# Patient Record
Sex: Male | Born: 1955 | Race: White | Hispanic: No | Marital: Married | Smoking: Current every day smoker
Health system: Southern US, Community
[De-identification: ages and names within clinical notes are randomized; demographics above are authoritative.]

## PROBLEM LIST (undated history)

## (undated) DIAGNOSIS — J339 Nasal polyp, unspecified: Secondary | ICD-10-CM

## (undated) DIAGNOSIS — E119 Type 2 diabetes mellitus without complications: Secondary | ICD-10-CM

## (undated) DIAGNOSIS — G4730 Sleep apnea, unspecified: Secondary | ICD-10-CM

## (undated) DIAGNOSIS — J189 Pneumonia, unspecified organism: Secondary | ICD-10-CM

## (undated) DIAGNOSIS — J449 Chronic obstructive pulmonary disease, unspecified: Secondary | ICD-10-CM

## (undated) DIAGNOSIS — E785 Hyperlipidemia, unspecified: Secondary | ICD-10-CM

## (undated) DIAGNOSIS — R091 Pleurisy: Secondary | ICD-10-CM

## (undated) DIAGNOSIS — I10 Essential (primary) hypertension: Secondary | ICD-10-CM

## (undated) HISTORY — DX: Type 2 diabetes mellitus without complications: E11.9

## (undated) HISTORY — DX: Pneumonia, unspecified organism: J18.9

## (undated) HISTORY — DX: Chronic obstructive pulmonary disease, unspecified: J44.9

## (undated) HISTORY — PX: HERNIA REPAIR: SHX51

## (undated) HISTORY — DX: Nasal polyp, unspecified: J33.9

## (undated) HISTORY — PX: BRONCHOSCOPY: SUR163

## (undated) HISTORY — DX: Hyperlipidemia, unspecified: E78.5

## (undated) HISTORY — DX: Sleep apnea, unspecified: G47.30

## (undated) HISTORY — DX: Pleurisy: R09.1

## (undated) HISTORY — PX: NASAL SINUS SURGERY: SHX719

---

## 1999-04-16 HISTORY — PX: HERNIA REPAIR: SHX51

## 2008-02-16 ENCOUNTER — Emergency Department: Payer: Self-pay | Admitting: Emergency Medicine

## 2008-03-03 ENCOUNTER — Ambulatory Visit: Payer: Self-pay | Admitting: Orthopedic Surgery

## 2008-04-25 ENCOUNTER — Ambulatory Visit: Payer: Self-pay | Admitting: Orthopedic Surgery

## 2008-04-26 ENCOUNTER — Ambulatory Visit: Payer: Self-pay | Admitting: Orthopedic Surgery

## 2008-08-23 ENCOUNTER — Ambulatory Visit: Payer: Self-pay | Admitting: Family Medicine

## 2008-09-02 ENCOUNTER — Ambulatory Visit: Payer: Self-pay | Admitting: Family Medicine

## 2010-01-12 IMAGING — US THYROID ULTRASOUND
1 series · 18 of 25 positions shown · non-contrast
Comparison: none

REASON FOR EXAM: neck nodule RTinterior
COMMENTS:

PROCEDURE:     US  - US SOFT TISSUE HEAD/NECK  - August 23, 2008  [DATE]
RESULT:     Ultrasound of the thyroid area is performed.
No thyroid tissue is reported. No definite mass is appreciated.

[Series 1: thyroid ultrasound · 18 of 41 slices shown]
[im 1/41]
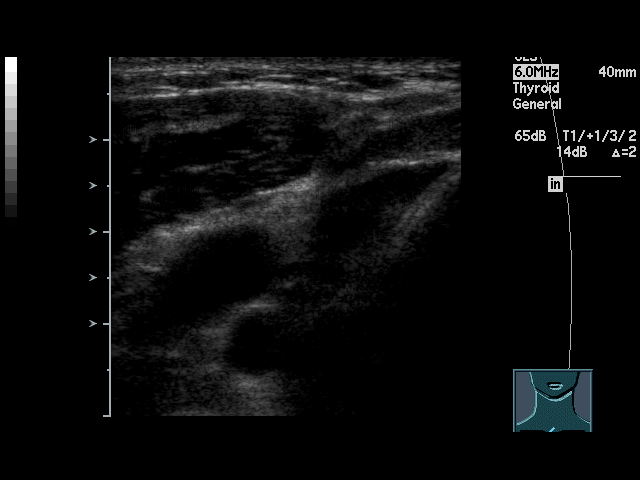
[im 4/41]
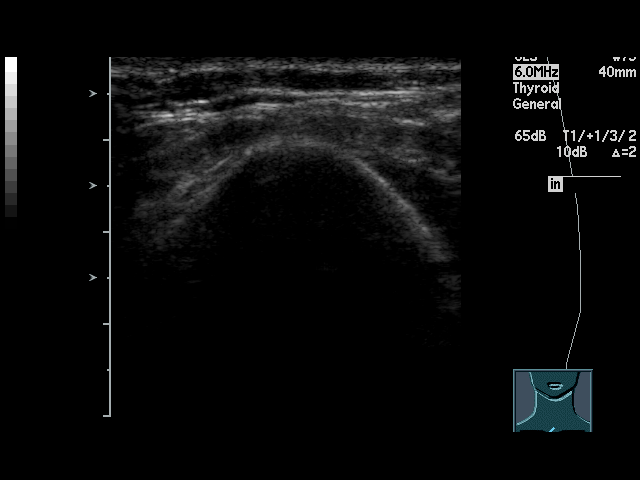
[im 6/41]
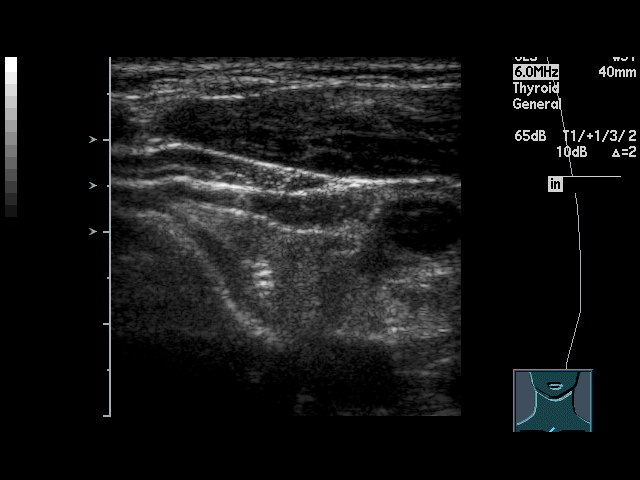
[im 7/41]
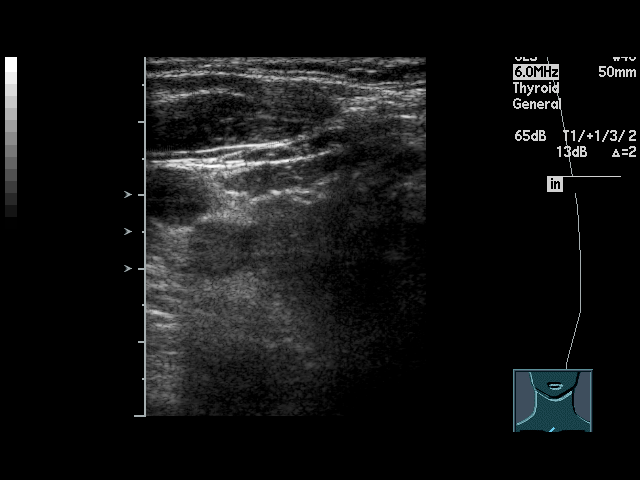
[im 11/41]
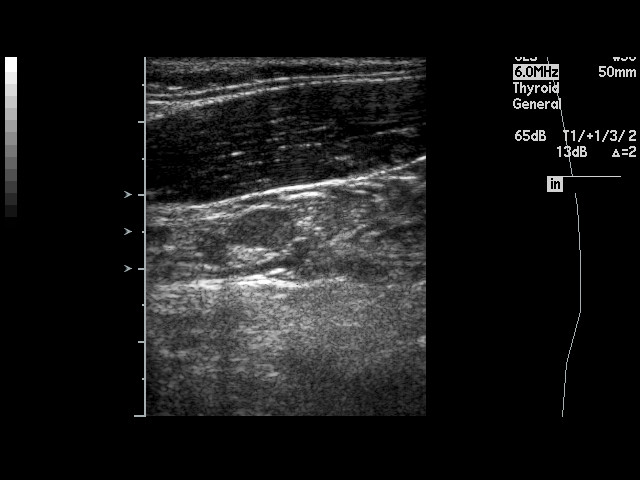
[im 12/41]
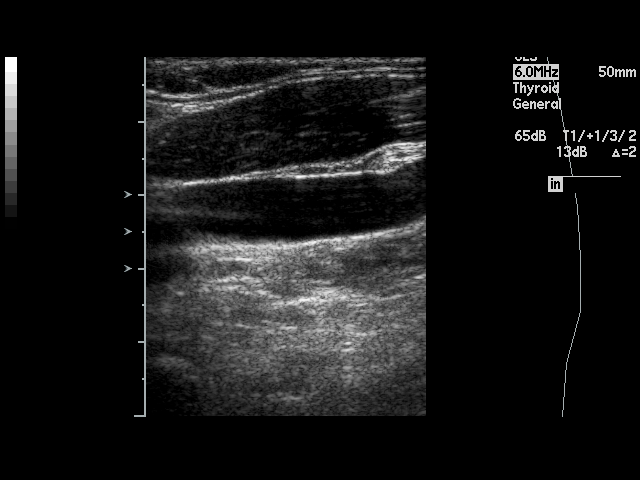
[im 16/41]
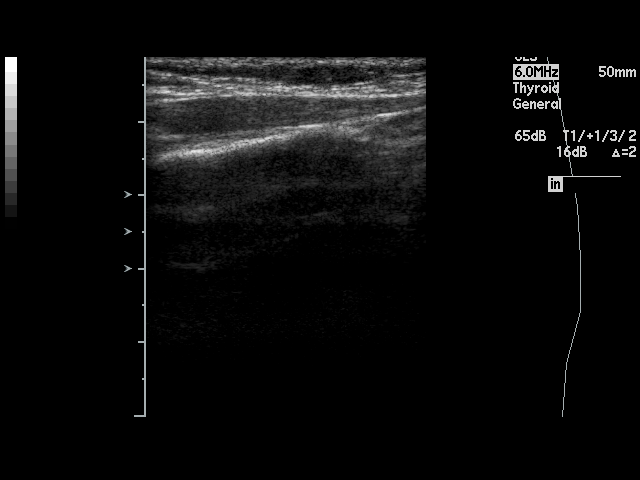
[im 17/41]
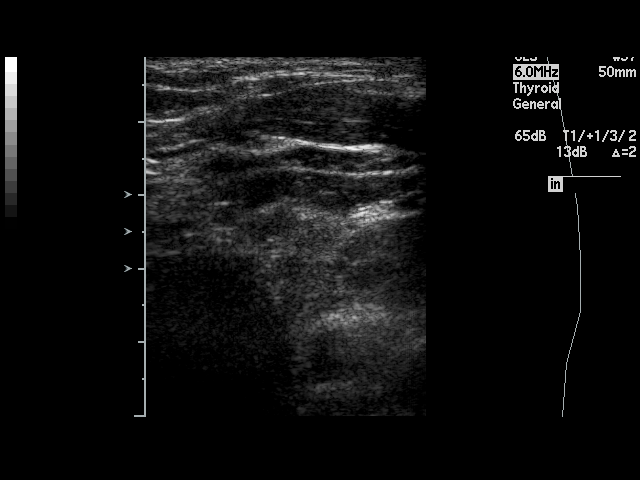
[im 19/41]
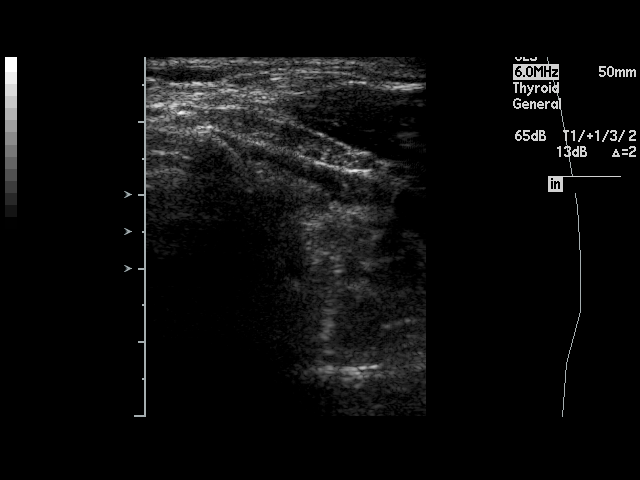
[im 22/41]
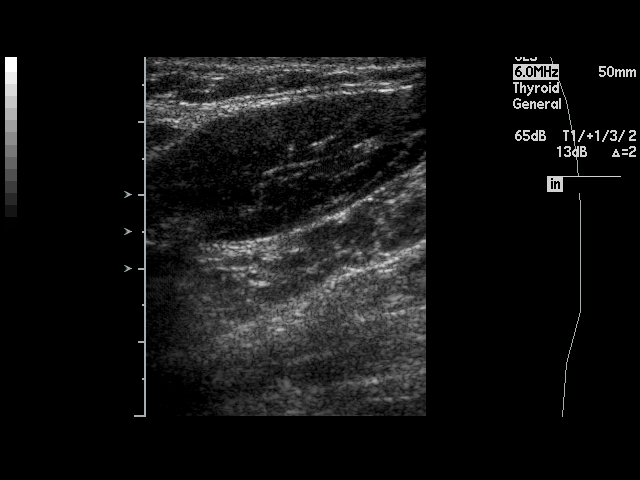
[im 24/41]
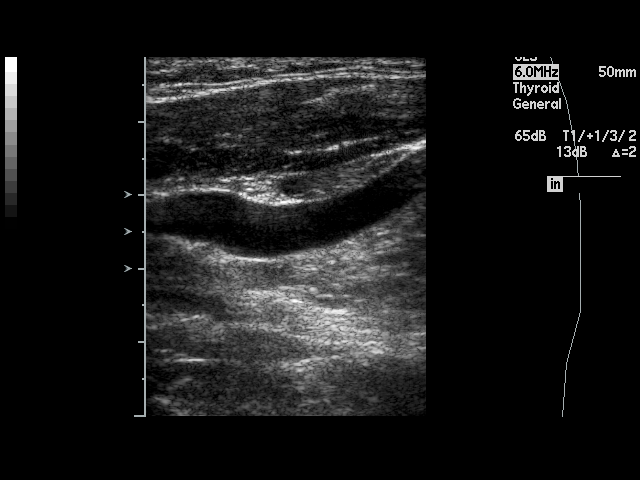
[im 26/41]
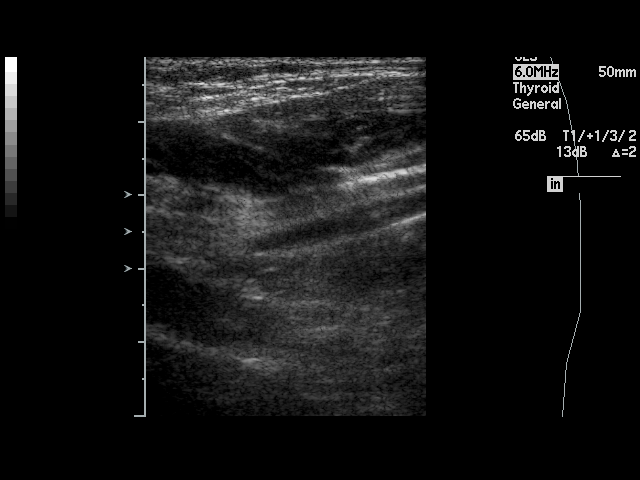
[im 29/41]
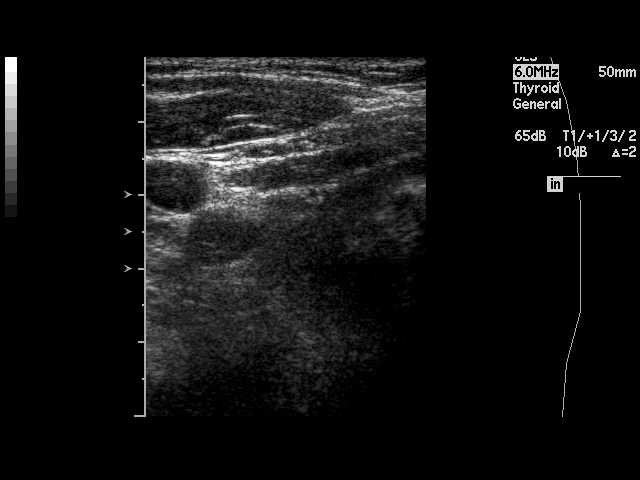
[im 31/41]
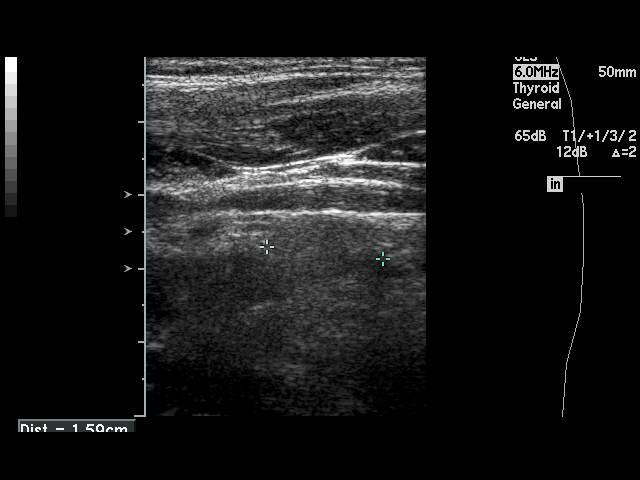
[im 34/41]
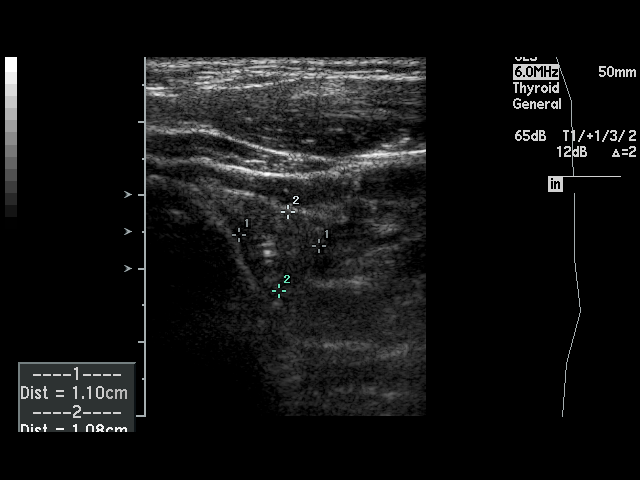
[im 36/41]
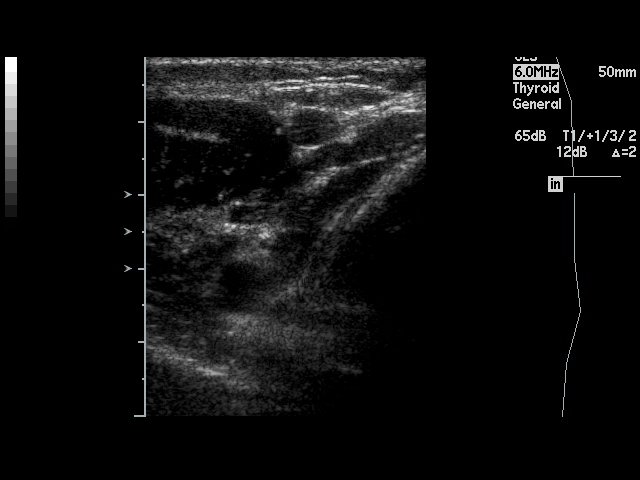
[im 37/41]
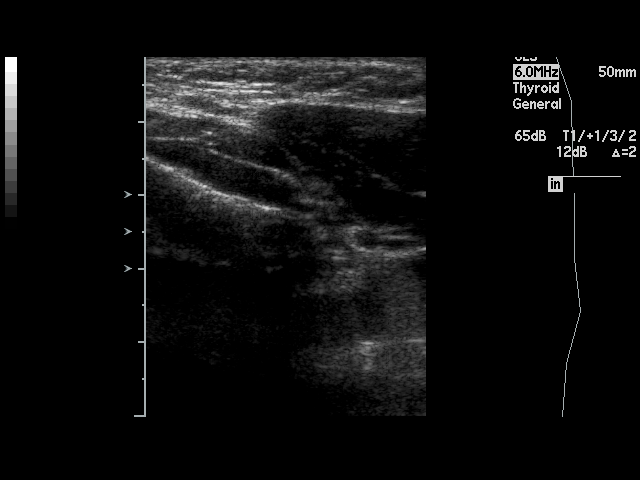
[im 41/41]
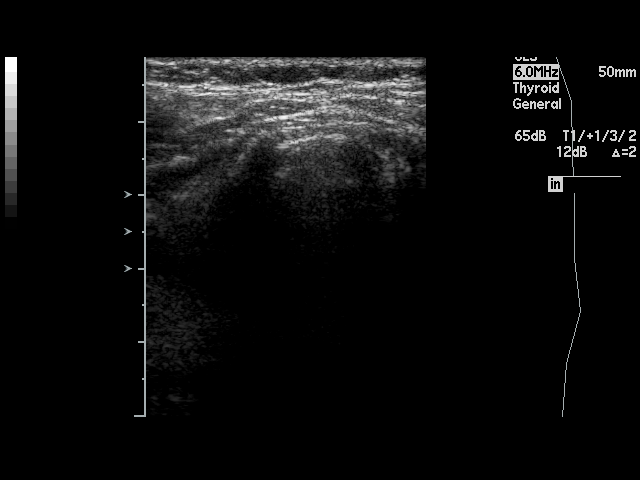

[18 of 25 positions shown; findings below may reference images not displayed]

IMPRESSION: No soft tissue mass appreciated. Correlate for history of
thyroidectomy.

## 2010-01-22 IMAGING — CT CT NECK WITH CONTRAST
2 series · 10 of 14 positions shown, 12 images · non-contrast
Comparison: none

REASON FOR EXAM: neck mass
COMMENTS:

[Series 2: soft tissue · axial · 0.49mm/px · z∈[+178,+396]mm · 7 of 99 slices shown, 9 images]
[im 13/99  soft-tissue]
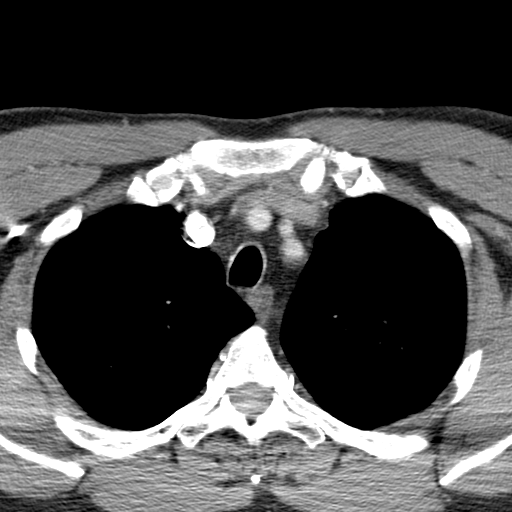
[im 13/99  bone]
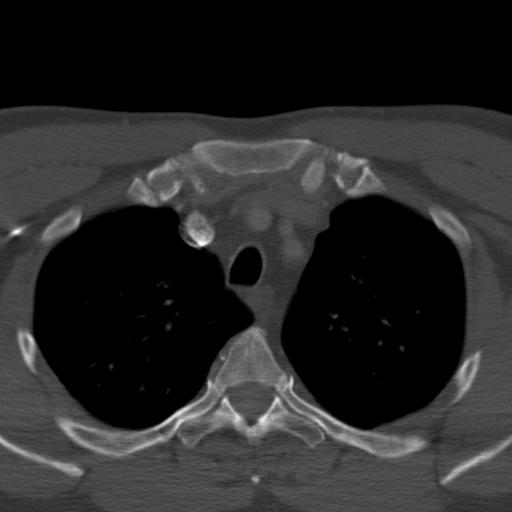
[im 25/99  bone]
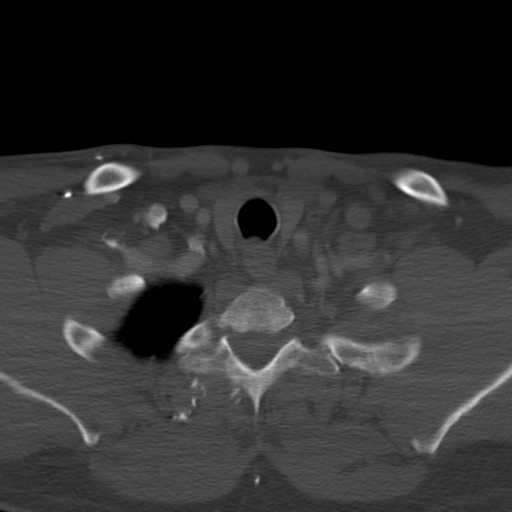
[im 37/99  bone]
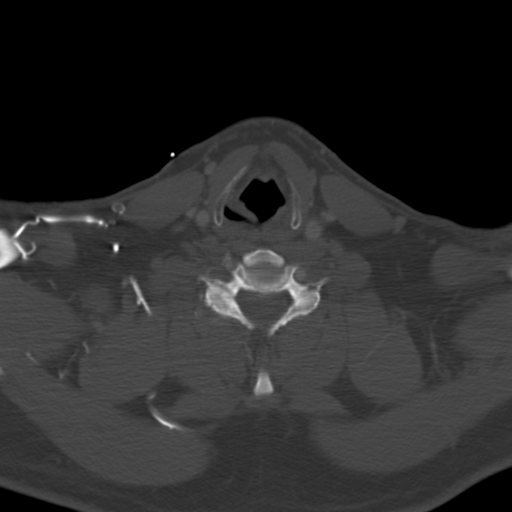
[im 50/99  bone]
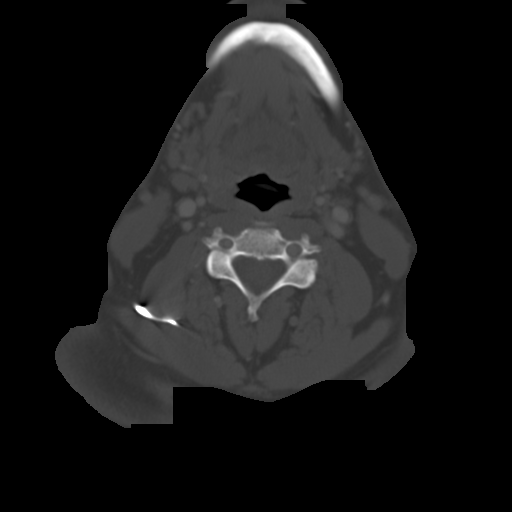
[im 62/99  soft-tissue]
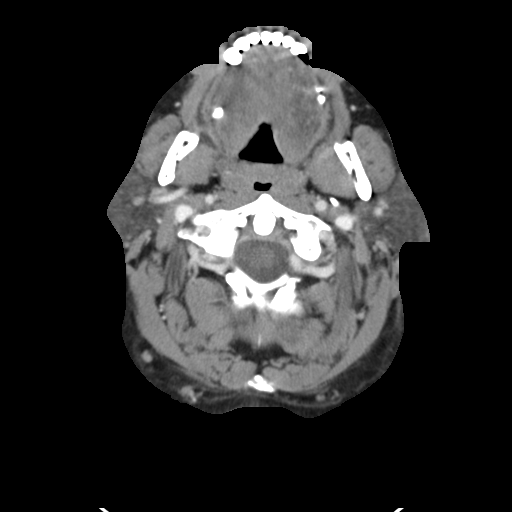
[im 62/99  bone]
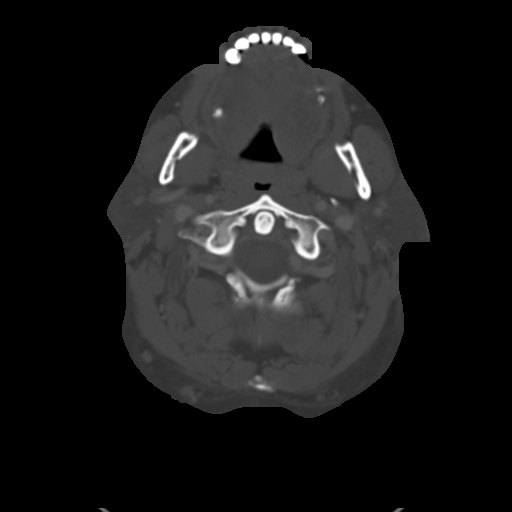
[im 74/99  bone]
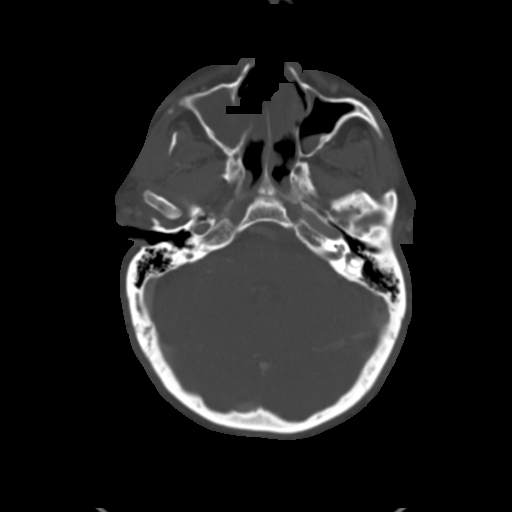
[im 86/99  bone]
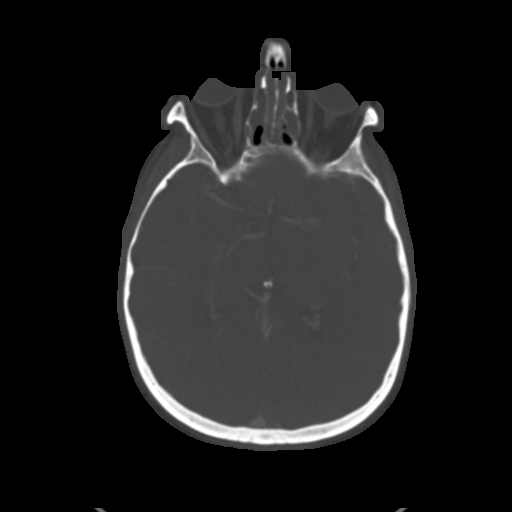

[Series 4: lung windows · axial · 0.66mm/px · z∈[+178,+250]mm · 3 of 49 slices shown]
[im 13/49  bone]
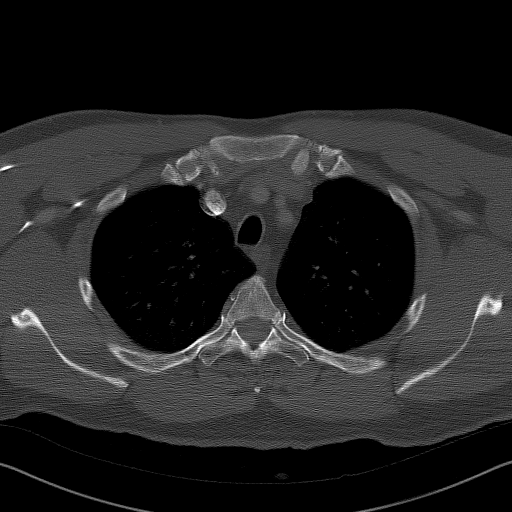
[im 25/49  bone]
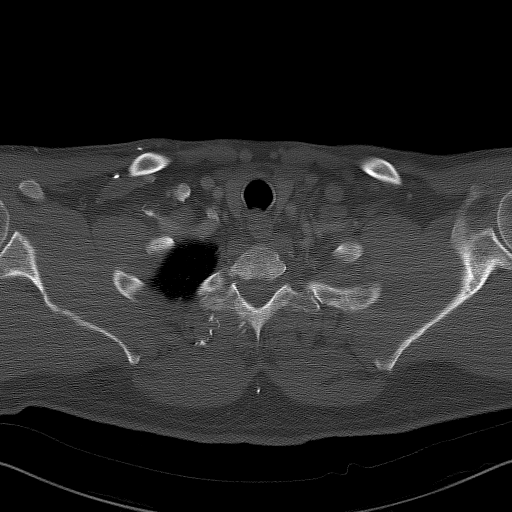
[im 37/49  bone]
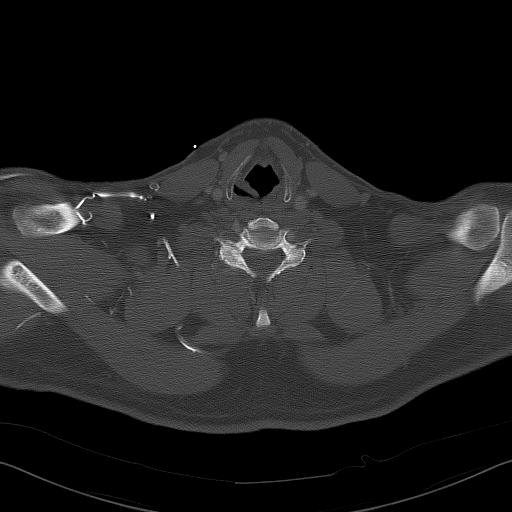

[10 of 14 positions shown; findings below may reference images not displayed]

PROCEDURE:     CT  - CT NECK WITH CONTRAST  - September 02, 2008 [DATE]

RESULT:     CT of the neck is performed utilizing 70 ml of Nsovue-UFQ
iodinated intravenous contrast. Multislice helical acquisition is
reconstructed at 3 mm slice thickness in the axial plane. There is no
previous CT for comparison. Correlation is made with the ultrasound targeted
to the right neck on 08/23/2008.

The area of concern is marked with a skin marker on image 62. There is no
underlying soft tissue lesion demonstrated. The thyroid lobes are small and
inferior to this level and appear to enhance homogeneously. The superior
mediastinum is unremarkable. There is no supraclavicular mass or adenopathy.
The paraspinal muscles appear to be within normal limits. Nonenlarged lymph
nodes are seen adjacent to the vascular structures in the submandibular
region. The submandibular and parotid glands appear unremarkable. The
tonsils appear to be unremarkable. The prevertebral and parapharyngeal
tissues are within normal limits.

There is extensive opacification of the sinuses. Previous sinus surgery is
noted in the medial maxillary wall on the left and on the right. Density is
seen in all the sinuses with some aeration in the ethmoids, sphenoids and
maxillary sinuses remaining. The frontal sinuses are completely opacified.
The base of the brain is unremarkable. The orbital regions are unremarkable.
IMPRESSION: 1. No evidence of a soft tissue mass in the right neck.
2. Extensive sinus disease with evidence of previous sinus surgery.
3. The thyroid lobes are present and enhance normally. These are inferior to
the area of concern in the right neck.
4. The images are somewhat degraded by motion artifact inferiorly.

## 2010-12-06 ENCOUNTER — Institutional Professional Consult (permissible substitution): Payer: Self-pay | Admitting: Pulmonary Disease

## 2010-12-28 ENCOUNTER — Ambulatory Visit: Payer: Self-pay | Admitting: Oncology

## 2011-01-03 ENCOUNTER — Ambulatory Visit: Payer: Self-pay | Admitting: Oncology

## 2011-01-14 ENCOUNTER — Ambulatory Visit: Payer: Self-pay | Admitting: Oncology

## 2011-02-14 ENCOUNTER — Ambulatory Visit: Payer: Self-pay | Admitting: Oncology

## 2011-04-05 ENCOUNTER — Emergency Department: Payer: Self-pay | Admitting: Emergency Medicine

## 2012-04-15 DIAGNOSIS — E119 Type 2 diabetes mellitus without complications: Secondary | ICD-10-CM

## 2012-04-15 HISTORY — DX: Type 2 diabetes mellitus without complications: E11.9

## 2012-07-13 ENCOUNTER — Inpatient Hospital Stay: Payer: Self-pay | Admitting: Internal Medicine

## 2012-07-13 DIAGNOSIS — I359 Nonrheumatic aortic valve disorder, unspecified: Secondary | ICD-10-CM

## 2012-07-13 LAB — COMPREHENSIVE METABOLIC PANEL
Albumin: 4.1 g/dL (ref 3.4–5.0)
Alkaline Phosphatase: 113 U/L (ref 50–136)
Anion Gap: 6 — ABNORMAL LOW (ref 7–16)
BUN: 12 mg/dL (ref 7–18)
Bilirubin,Total: 1 mg/dL (ref 0.2–1.0)
Calcium, Total: 8.9 mg/dL (ref 8.5–10.1)
Chloride: 99 mmol/L (ref 98–107)
Creatinine: 0.91 mg/dL (ref 0.60–1.30)
EGFR (African American): >60
EGFR (Non-African Amer.): >60
SGOT(AST): 25 U/L (ref 15–37)
SGPT (ALT): 35 U/L (ref 12–78)
Sodium: 134 mmol/L — ABNORMAL LOW (ref 136–145)

## 2012-07-13 LAB — CBC
HCT: 49.9 % (ref 40.0–52.0)
HGB: 17.7 g/dL (ref 13.0–18.0)
MCHC: 35.5 g/dL (ref 32.0–36.0)
MCV: 86 fL (ref 80–100)
RBC: 5.84 10*6/uL (ref 4.40–5.90)
WBC: 8.3 10*3/uL (ref 3.8–10.6)

## 2012-07-13 LAB — TROPONIN I: Troponin-I: <0.02 ng/mL

## 2012-07-13 LAB — CK TOTAL AND CKMB (NOT AT ARMC): CK, Total: 177 U/L (ref 35–232)

## 2012-07-14 LAB — CBC WITH DIFFERENTIAL/PLATELET
Basophil #: 0 10*3/uL (ref 0.0–0.1)
Lymphocyte #: 0.4 10*3/uL — ABNORMAL LOW (ref 1.0–3.6)
Lymphocyte %: 2.5 %
MCH: 29.6 pg (ref 26.0–34.0)
MCHC: 34.3 g/dL (ref 32.0–36.0)
MCV: 86 fL (ref 80–100)
Neutrophil #: 13.7 10*3/uL — ABNORMAL HIGH (ref 1.4–6.5)
Neutrophil %: 93.4 %
Platelet: 153 10*3/uL (ref 150–440)
RBC: 5.61 10*6/uL (ref 4.40–5.90)

## 2012-07-14 LAB — BASIC METABOLIC PANEL
Anion Gap: 7 (ref 7–16)
Calcium, Total: 9.5 mg/dL (ref 8.5–10.1)
Chloride: 99 mmol/L (ref 98–107)
Co2: 28 mmol/L (ref 21–32)
Creatinine: 0.97 mg/dL (ref 0.60–1.30)
EGFR (African American): >60
EGFR (Non-African Amer.): >60
Glucose: 355 mg/dL — ABNORMAL HIGH (ref 65–99)
Osmolality: 287 (ref 275–301)
Potassium: 4 mmol/L (ref 3.5–5.1)
Sodium: 134 mmol/L — ABNORMAL LOW (ref 136–145)

## 2012-07-15 LAB — CBC WITH DIFFERENTIAL/PLATELET
Basophil #: 0 10*3/uL (ref 0.0–0.1)
Eosinophil #: 0 10*3/uL (ref 0.0–0.7)
Eosinophil %: 0 %
HCT: 49.2 % (ref 40.0–52.0)
HGB: 16.8 g/dL (ref 13.0–18.0)
Lymphocyte #: 0.6 10*3/uL — ABNORMAL LOW (ref 1.0–3.6)
Lymphocyte %: 2.9 %
MCH: 29.6 pg (ref 26.0–34.0)
MCHC: 34.3 g/dL (ref 32.0–36.0)
MCV: 86 fL (ref 80–100)
Monocyte #: 0.7 x10 3/mm (ref 0.2–1.0)
Neutrophil #: 18.3 10*3/uL — ABNORMAL HIGH (ref 1.4–6.5)
Neutrophil %: 93.3 %
Platelet: 173 10*3/uL (ref 150–440)
RBC: 5.69 10*6/uL (ref 4.40–5.90)
RDW: 13.6 % (ref 11.5–14.5)
WBC: 19.6 10*3/uL — ABNORMAL HIGH (ref 3.8–10.6)

## 2012-07-15 LAB — BASIC METABOLIC PANEL
BUN: 33 mg/dL — ABNORMAL HIGH (ref 7–18)
Calcium, Total: 9 mg/dL (ref 8.5–10.1)
Chloride: 100 mmol/L (ref 98–107)
Creatinine: 0.93 mg/dL (ref 0.60–1.30)
EGFR (Non-African Amer.): >60
Potassium: 4.1 mmol/L (ref 3.5–5.1)

## 2012-07-16 LAB — HEMOGLOBIN A1C: Hemoglobin A1C: 10 % — ABNORMAL HIGH (ref 4.2–6.3)

## 2012-07-19 LAB — CREATININE, SERUM
Creatinine: 1.02 mg/dL (ref 0.60–1.30)
EGFR (African American): >60

## 2012-08-21 ENCOUNTER — Ambulatory Visit: Payer: Self-pay | Admitting: Specialist

## 2012-10-14 ENCOUNTER — Inpatient Hospital Stay: Payer: Self-pay | Admitting: Family Medicine

## 2012-10-14 LAB — CBC WITH DIFFERENTIAL/PLATELET
Basophil #: 0.1 10*3/uL (ref 0.0–0.1)
Eosinophil #: 0.1 10*3/uL (ref 0.0–0.7)
HCT: 44.5 % (ref 40.0–52.0)
HGB: 15.5 g/dL (ref 13.0–18.0)
Lymphocyte #: 1.2 10*3/uL (ref 1.0–3.6)
Monocyte #: 1.4 x10 3/mm — ABNORMAL HIGH (ref 0.2–1.0)
Monocyte %: 7.6 %
Platelet: 140 10*3/uL — ABNORMAL LOW (ref 150–440)
RDW: 14.3 % (ref 11.5–14.5)
WBC: 18.7 10*3/uL — ABNORMAL HIGH (ref 3.8–10.6)

## 2012-10-14 LAB — COMPREHENSIVE METABOLIC PANEL
Albumin: 4 g/dL (ref 3.4–5.0)
Alkaline Phosphatase: 98 U/L (ref 50–136)
Anion Gap: 10 (ref 7–16)
Calcium, Total: 9.4 mg/dL (ref 8.5–10.1)
Chloride: 100 mmol/L (ref 98–107)
Co2: 26 mmol/L (ref 21–32)
EGFR (African American): >60
EGFR (Non-African Amer.): >60
Osmolality: 275 (ref 275–301)
Potassium: 3.8 mmol/L (ref 3.5–5.1)
SGOT(AST): 17 U/L (ref 15–37)
Sodium: 136 mmol/L (ref 136–145)

## 2012-10-15 LAB — CBC WITH DIFFERENTIAL/PLATELET
Basophil #: 0 10*3/uL (ref 0.0–0.1)
Basophil %: 0.1 %
Eosinophil #: 0 10*3/uL (ref 0.0–0.7)
Eosinophil %: 0 %
MCH: 29.8 pg (ref 26.0–34.0)
MCHC: 35.2 g/dL (ref 32.0–36.0)
Neutrophil #: 18.2 10*3/uL — ABNORMAL HIGH (ref 1.4–6.5)
Platelet: 133 10*3/uL — ABNORMAL LOW (ref 150–440)
RBC: 5.15 10*6/uL (ref 4.40–5.90)
WBC: 19.3 10*3/uL — ABNORMAL HIGH (ref 3.8–10.6)

## 2012-10-15 LAB — BASIC METABOLIC PANEL
BUN: 12 mg/dL (ref 7–18)
Calcium, Total: 9.1 mg/dL (ref 8.5–10.1)
Chloride: 103 mmol/L (ref 98–107)
Co2: 28 mmol/L (ref 21–32)
Creatinine: 1.17 mg/dL (ref 0.60–1.30)
Osmolality: 287 (ref 275–301)

## 2012-10-16 LAB — CBC WITH DIFFERENTIAL/PLATELET
Basophil #: 0.1 10*3/uL (ref 0.0–0.1)
Basophil %: 0.4 %
Eosinophil #: 0 10*3/uL (ref 0.0–0.7)
HGB: 14.1 g/dL (ref 13.0–18.0)
Lymphocyte #: 1 10*3/uL (ref 1.0–3.6)
Lymphocyte %: 4.8 %
MCH: 30.1 pg (ref 26.0–34.0)
MCV: 84 fL (ref 80–100)
Monocyte #: 1.2 x10 3/mm — ABNORMAL HIGH (ref 0.2–1.0)
Neutrophil #: 18.1 10*3/uL — ABNORMAL HIGH (ref 1.4–6.5)
Neutrophil %: 88.9 %
Platelet: 151 10*3/uL (ref 150–440)

## 2012-10-17 LAB — CBC WITH DIFFERENTIAL/PLATELET
Eosinophil #: 0.1 10*3/uL (ref 0.0–0.7)
HCT: 40.4 % (ref 40.0–52.0)
HGB: 14 g/dL (ref 13.0–18.0)
MCH: 29.6 pg (ref 26.0–34.0)
MCV: 86 fL (ref 80–100)
Monocyte #: 0.8 x10 3/mm (ref 0.2–1.0)
Neutrophil #: 8.6 10*3/uL — ABNORMAL HIGH (ref 1.4–6.5)
Neutrophil %: 74.7 %
Platelet: 144 10*3/uL — ABNORMAL LOW (ref 150–440)
RDW: 14.3 % (ref 11.5–14.5)
WBC: 11.5 10*3/uL — ABNORMAL HIGH (ref 3.8–10.6)

## 2012-10-17 LAB — BASIC METABOLIC PANEL
Anion Gap: 7 (ref 7–16)
BUN: 19 mg/dL — ABNORMAL HIGH (ref 7–18)
Calcium, Total: 8.6 mg/dL (ref 8.5–10.1)
Chloride: 104 mmol/L (ref 98–107)
Co2: 29 mmol/L (ref 21–32)
Creatinine: 1 mg/dL (ref 0.60–1.30)
EGFR (Non-African Amer.): >60
Glucose: 189 mg/dL — ABNORMAL HIGH (ref 65–99)
Osmolality: 287 (ref 275–301)
Potassium: 3.4 mmol/L — ABNORMAL LOW (ref 3.5–5.1)
Sodium: 140 mmol/L (ref 136–145)

## 2012-10-18 LAB — CBC WITH DIFFERENTIAL/PLATELET
Basophil #: 0.1 10*3/uL (ref 0.0–0.1)
Basophil %: 0.6 %
Eosinophil #: 0.2 10*3/uL (ref 0.0–0.7)
Eosinophil %: 2 %
HCT: 40.1 % (ref 40.0–52.0)
HGB: 14.2 g/dL (ref 13.0–18.0)
Lymphocyte #: 1.9 10*3/uL (ref 1.0–3.6)
Lymphocyte %: 18.7 %
MCH: 30 pg (ref 26.0–34.0)
MCHC: 35.4 g/dL (ref 32.0–36.0)
MCV: 85 fL (ref 80–100)
Monocyte #: 0.7 x10 3/mm (ref 0.2–1.0)
Monocyte %: 7.5 %
Neutrophil #: 7.1 10*3/uL — ABNORMAL HIGH (ref 1.4–6.5)
Neutrophil %: 71.2 %
Platelet: 153 10*3/uL (ref 150–440)
RBC: 4.74 10*6/uL (ref 4.40–5.90)
RDW: 14.2 % (ref 11.5–14.5)
WBC: 10 10*3/uL (ref 3.8–10.6)

## 2012-10-18 LAB — POTASSIUM: Potassium: 3.8 mmol/L (ref 3.5–5.1)

## 2012-10-19 LAB — CULTURE, BLOOD (SINGLE)

## 2013-04-15 DIAGNOSIS — R091 Pleurisy: Secondary | ICD-10-CM

## 2013-04-15 HISTORY — DX: Pleurisy: R09.1

## 2014-03-21 ENCOUNTER — Inpatient Hospital Stay: Payer: Self-pay | Admitting: Internal Medicine

## 2014-03-21 DIAGNOSIS — A419 Sepsis, unspecified organism: Secondary | ICD-10-CM | POA: Diagnosis not present

## 2014-03-21 LAB — COMPREHENSIVE METABOLIC PANEL
ALBUMIN: 3.5 g/dL (ref 3.4–5.0)
ALT: 34 U/L
AST: 22 U/L (ref 15–37)
Alkaline Phosphatase: 150 U/L — ABNORMAL HIGH
Anion Gap: 12 (ref 7–16)
BILIRUBIN TOTAL: 1 mg/dL (ref 0.2–1.0)
BUN: 17 mg/dL (ref 7–18)
CHLORIDE: 99 mmol/L (ref 98–107)
CO2: 27 mmol/L (ref 21–32)
Calcium, Total: 9 mg/dL (ref 8.5–10.1)
Creatinine: 1.39 mg/dL — ABNORMAL HIGH (ref 0.60–1.30)
EGFR (African American): >60
EGFR (Non-African Amer.): 56 — ABNORMAL LOW
GLUCOSE: 184 mg/dL — AB (ref 65–99)
Osmolality: 282 (ref 275–301)
POTASSIUM: 4.5 mmol/L (ref 3.5–5.1)
SODIUM: 138 mmol/L (ref 136–145)
TOTAL PROTEIN: 7.9 g/dL (ref 6.4–8.2)

## 2014-03-21 LAB — URINALYSIS, COMPLETE
BACTERIA: NONE SEEN
Bilirubin,UR: NEGATIVE
Blood: NEGATIVE
Glucose,UR: >=500 mg/dL (ref 0–75)
Leukocyte Esterase: NEGATIVE
Nitrite: NEGATIVE
PH: 5 (ref 4.5–8.0)
Protein: NEGATIVE
Specific Gravity: 1.042 (ref 1.003–1.030)
Squamous Epithelial: NONE SEEN

## 2014-03-21 LAB — CBC
HCT: 48.4 % (ref 40.0–52.0)
HGB: 15.8 g/dL (ref 13.0–18.0)
MCH: 29.5 pg (ref 26.0–34.0)
MCHC: 32.6 g/dL (ref 32.0–36.0)
MCV: 91 fL (ref 80–100)
PLATELETS: 152 10*3/uL (ref 150–440)
RBC: 5.35 10*6/uL (ref 4.40–5.90)
RDW: 14.2 % (ref 11.5–14.5)
WBC: 17.6 10*3/uL — ABNORMAL HIGH (ref 3.8–10.6)

## 2014-03-21 LAB — TROPONIN I

## 2014-03-21 LAB — CK TOTAL AND CKMB (NOT AT ARMC)
CK, Total: 154 U/L (ref 39–308)
CK-MB: 2.8 ng/mL (ref 0.5–3.6)

## 2014-03-22 LAB — CBC WITH DIFFERENTIAL/PLATELET
BASOS ABS: 0 10*3/uL (ref 0.0–0.1)
Basophil %: 0.1 %
EOS ABS: 0 10*3/uL (ref 0.0–0.7)
Eosinophil %: 0.1 %
HCT: 42.4 % (ref 40.0–52.0)
HGB: 13.9 g/dL (ref 13.0–18.0)
LYMPHS PCT: 3.1 %
Lymphocyte #: 0.5 10*3/uL — ABNORMAL LOW (ref 1.0–3.6)
MCH: 29.7 pg (ref 26.0–34.0)
MCHC: 32.8 g/dL (ref 32.0–36.0)
MCV: 91 fL (ref 80–100)
Monocyte #: 0.6 x10 3/mm (ref 0.2–1.0)
Monocyte %: 4 %
Neutrophil #: 14.1 10*3/uL — ABNORMAL HIGH (ref 1.4–6.5)
Neutrophil %: 92.7 %
PLATELETS: 129 10*3/uL — AB (ref 150–440)
RBC: 4.68 10*6/uL (ref 4.40–5.90)
RDW: 14.4 % (ref 11.5–14.5)
WBC: 15.2 10*3/uL — AB (ref 3.8–10.6)

## 2014-03-22 LAB — BASIC METABOLIC PANEL
Anion Gap: 8 (ref 7–16)
BUN: 19 mg/dL — AB (ref 7–18)
CALCIUM: 8.6 mg/dL (ref 8.5–10.1)
CREATININE: 1.11 mg/dL (ref 0.60–1.30)
Chloride: 103 mmol/L (ref 98–107)
Co2: 26 mmol/L (ref 21–32)
EGFR (African American): >60
EGFR (Non-African Amer.): >60
GLUCOSE: 199 mg/dL — AB (ref 65–99)
OSMOLALITY: 282 (ref 275–301)
POTASSIUM: 4.2 mmol/L (ref 3.5–5.1)
Sodium: 137 mmol/L (ref 136–145)

## 2014-03-22 LAB — MAGNESIUM: Magnesium: 2.2 mg/dL

## 2014-03-23 LAB — INFLUENZA A,B,H1N1 - PCR (ARMC)
H1N1FLUPCR: NOT DETECTED
Influenza A By PCR: NEGATIVE
Influenza B By PCR: NEGATIVE

## 2014-03-25 LAB — CBC WITH DIFFERENTIAL/PLATELET
Bands: 1 %
COMMENT - H1-COM1: NORMAL
COMMENT - H1-COM2: NORMAL
HCT: 40.4 % (ref 40.0–52.0)
HGB: 13 g/dL (ref 13.0–18.0)
Lymphocytes: 10 %
MCH: 29.3 pg (ref 26.0–34.0)
MCHC: 32.2 g/dL (ref 32.0–36.0)
MCV: 91 fL (ref 80–100)
METAMYELOCYTE: 2 %
Platelet: 153 10*3/uL (ref 150–440)
RBC: 4.44 10*6/uL (ref 4.40–5.90)
RDW: 14 % (ref 11.5–14.5)
SEGMENTED NEUTROPHILS: 81 %
VARIANT LYMPHOCYTE - H1-RLYMPH: 6 %
WBC: 12.1 10*3/uL — ABNORMAL HIGH (ref 3.8–10.6)

## 2014-03-25 LAB — CREATININE, SERUM: Creatinine: 0.94 mg/dL (ref 0.60–1.30)

## 2014-03-25 LAB — PRO B NATRIURETIC PEPTIDE: B-Type Natriuretic Peptide: 1907 pg/mL — ABNORMAL HIGH (ref 0–125)

## 2014-03-26 LAB — EXPECTORATED SPUTUM ASSESSMENT W REFEX TO RESP CULTURE

## 2014-03-26 LAB — CULTURE, BLOOD (SINGLE)

## 2014-08-05 NOTE — Consult Note (Signed)
Brief Consult Note: Diagnosis: Panic disorder with agoraphobia, PTSD, delirium secondary to medical condition/medications.   Patient was seen by consultant.   Consult note dictated.   Recommend further assessment or treatment.   Orders entered.   Comments: Mr. Curtis Russell has a h/o severe anxiety. His medications are prescribed by his PCP. He does not have a psychiatrist. He desires to work with a Set designerChristian therapist in the future to resolve issues surrounding his mother;s daeth in July 2013. he is here for exacerbation of COPD.  PLAN: 1. Please continue Effexor for depression/anxiety. Will continue Clonazepam 1 mg tid.   2. We will hold stimulants. They usually worsen anxiety.  3. Will offer Minipress for severe PTSD related nightmares and flashbacks.  4. will try to find a therapist in the comunity.  Electronic Signatures: Curtis Russell, Curtis Russell (MD)  (Signed 02-Apr-14 11:43)  Authored: Brief Consult Note   Last Updated: 02-Apr-14 11:43 by Curtis Russell, Curtis Russell (MD)

## 2014-08-05 NOTE — H&P (Signed)
PATIENT NAME:  Curtis Russell, Curtis Russell MR#:  [id number] DATE OF BIRTH:  Sep 06, 1955  DATE OF ADMISSION:  07/13/2012  PRIMARY CARE PHYSICIAN: Jerl MinaJames Hedrick, MD  REFERRING PHYSICIAN: Chiquita LothJade Sung, MD  CHIEF COMPLAINT: Shortness of breath.   HISTORY OF PRESENT ILLNESS: The patient is a 59 year old Caucasian male who is morbidly obese with a past medical history of COPD, still smoking, hypertension, hyperlipidemia, diabetes mellitus and chronic nasal polyps who is presenting to the ER with the chief complaint of shortness of breath. The patient is reporting that he has been short of breath for the past 2 to 3 days and slowly it has been getting worse. He is reporting that every spring his nasal polyps act up and he becomes shortness of breath. Although the patient has been experiencing shortness of breath for the past 2 to 3 days, it has been worse on Sunday. He has been coughing and feeling tight in his chest. Also, he could hear wheezing when breathing and the patient has unable to talk in full sentences. His wife called EMS and the patient was brought into the ER. The patient has received continuous nebulizer treatments and Solu-Medrol 125 mg IV. Chest x-ray has revealed no acute findings. Hospitalist team is called to admit the patient for acute exacerbation of COPD. During my examination, the patient is still working hard and was unable to talk in complete sentences. He is feeling tired. Blood gas was ordered which has revealed a pH of 7.35, pCO2 45, and pO2 of 50.  As the patient is hypoxemic and still short of breath with audible wheezing and accessory muscle usage, he is placed on BiPAP. The patient's wife is at bedside. He denies any chest pain, dizziness or loss of consciousness. No complaints of nausea, vomiting or diarrhea. Denies any fevers but complaining of runny nose, sneezing and coughing.   PAST MEDICAL HISTORY:  1.  Chronic history of COPD. 2.  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 3.  Hypertension. 4.   Hyperlipidemia. 5.  Diabetes mellitus type 2.   PAST SURGICAL HISTORY:  1.  Hernia repair. 2.  Ganglion cyst resection.  infection.   ALLERGIES: BEE STINGS.  PSYCHOSOCIAL HISTORY: Lives at home with wife. He smokes 1-1/2 to 2 packs a day. Occasional intake of wine. Denies any street drugs.   FAMILY HISTORY: His mom has history of congestive heart failure and COPD.  Dad has history of diabetes mellitus and congestive heart failure.   HOME MEDICATIONS:  1.  Trazodone 50 mg once a day. 2.  Symbicort 160 mg/4.5 two puffs inhalation 2 times a day. 3.  Ritalin 20 mg 2 tablets once a day. 4.  Metformin 500 mg twice a day. 5.  Hydrochlorothiazide/lisinopril 25/20 mg p.o. once daily. 6.  Clonazepam 1 mg at bedtime and 2 tablets (2 mg q. a.m.). 7.  Effexor XL 150 mg 1 capsule p.o. once daily.   REVIEW OF SYSTEMS: CONSTITUTIONAL: Denies any fever, but complaining of fatigue and tiredness. No weight loss or weight gain.  EYES: No glaucoma or cataracts. No blurry vision.  ENT: Denies any tinnitus, epistaxis, discharge, hearing loss, snoring or postnasal drip.  RESPIRATORY: Complaining of cough. Positive wheezing. Dyspneic. Has chronic history of COPD. Denies any hemoptysis and denies any painful respiration.  CARDIOVASCULAR: No chest pain, orthopnea, palpitations or syncope.  GASTROINTESTINAL: No nausea, vomiting, diarrhea or GERD.  GENITOURINARY: No dysuria or hematuria.  ENDOCRINE:  No polyuria, nocturia or thyroid problems. HEMATOLOGIC/LYMPHATIC: Denies any anemia or easy bruising. No recent  history of bleeding.  INTEGUMENTARY: No acne, rash or lesions.  MUSCULOSKELETAL: No joint pain in the neck, back, shoulder, knees or hips. Denies any arthritis. No history of gout.  NEUROLOGIC: Denies any vertigo, ataxia or dementia. No history of CVA or TIA in the past PSYCH:  Has chronic history of insomnia. Has chronic history of insomnia. Denies any bipolar disorder, depression or schizophrenia.    PHYSICAL EXAMINATION: VITAL SIGNS: Temperature 98.3, pulse 108, respirations 35, blood pressure is 158/74 and pulse oximetry 96% on 2 liters.  GENERAL APPEARANCE: In mild to moderate distress, using accessory muscles. HEENT:  Normocephalic, atraumatic. Pupils are equally reacting to light and accommodation. No scleral icterus. No conjunctival injection. Extraocular movements are intact. No sinus tenderness. Nares are with mucus. Moist mucous membranes. Oropharynx is intact. No exudates. Dentition is grossly normal.  NECK: Supple. No JVD. No thyromegaly. No lymphadenopathy.  LUNGS: Coarse bronchial breath sounds with diffuse wheezing and accessory muscle usage. No anterior chest wall tenderness on palpation.  HEART:  S1 and S2 regular rate and rhythm, tachycardic. No murmurs. No peripheral edema. Peripheral pulses are 2+.  GASTROINTESTINAL: Soft, obese. Bowel sounds are positive in all 4 quadrants. Nontender and nondistended. No masses felt. No hepatosplenomegaly.  NEUROLOGIC:  Awake, alert and oriented x 3. Cranial nerves II through XII are intact. Motor and sensory are intact. Reflexes are 2+.  MUSCULOSKELETAL: No peripheral edema or cyanosis or clubbing. Peripheral pulses are 2+.  SKIN: Normal turgor. Warm to touch. No lesions. No rashes.  PSYCHIATRIC: Normal mood and affect.  MUSCULOSKELETAL: Range of motion of all 4 extremities is grossly intact   LABORATORY AND DIAGNOSTICS:  Chest x-ray:  No acute findings. A 12-lead EKG: Sinus tachy at 119 beats per minute, normal PR interval, normal QRS duration, incomplete right bundle branch block, and right axis deviation. Nonspecific ST-T wave changes are present.   ABG: pH is 7.35, pCO2 45, pO2 50. Calcium 1.13. Oxyhemoglobin 80.9. Carboxyhemoglobin 2.7.   Hemoglobin 0.9. Pulse ox 83.8.   Glucose 276, BUN 12, creatinine 0.91, sodium 134, potassium 3.6, chloride 99, CO2 29, GFR greater than 60, anion gap 6, serum osmolality 278 and calcium 8.9. LFTs  are within normal range. CK total 177, CPK-MB 2.6 and troponin less than 0.02. WBC 8.3, hemoglobin 17.7, hematocrit 39.9, platelets 133 and MCV 87.    ASSESSMENT AND PLAN: A 59 year old Caucasian male with chronic history of chronic obstructive pulmonary disease, still smoking 2 packs per day, who is coming to the ER with worsening of shortness of breath associated with cough and chest tightness.  1.  Acute respiratory failure with hypoxia. The patient was started on BiPAP. Will repeat ABGs at 9:00 a.m. in the morning.  2.  Acute exacerbation of chronic obstructive pulmonary disease. We will provide Solu-Medrol to be given at 60 mg intravenous every 8 hours. We will provide nebulizer treatments and IV levofloxacin.  3.  Nicotine dependence. The patient was counseled to quit smoking for 15 minutes and will start him on a nicotine patch.  4.  Hypertension. Blood pressure is elevated, probably from the acute respiratory failure and stress induced. Will monitor and titrate medications on an as needed basis.  5.  Diabetes mellitus. Continue sliding scale insulin and his home medication.  6.  Hyperlipidemia. Continue statin.  7.  History of polycythemia. Please follow up with oncology as an outpatient as recommended by them.  8.  We will provide gastrointestinal prophylaxis with Protonix and deep vein thrombosis prophylaxis with Lovenox  subcutaneous.   The patient is FULL CODE. The diagnosis and plan of care was discussed in detail with the patient. He is aware of the plan.   TOTAL CRITICAL CARE TIME SPENT: 60 minutes. ____________________________ Ramonita Lab, MD ag:sb D: 07/13/2012 03:48:46 ET T: 07/13/2012 07:05:52 ET JOB#: [id number]  cc: Ramonita Lab, MD, <Dictator> Rhona Leavens. Burnett Sheng, MD Ramonita Lab MD ELECTRONICALLY SIGNED 07/21/2012 6:37

## 2014-08-05 NOTE — Discharge Summary (Signed)
PATIENT NAME:  Curtis Russell, Curtis Russell MR#:  [id number] DATE OF BIRTH:  1955-11-05  DATE OF ADMISSION:  07/13/2012 DATE OF DISCHARGE:  07/20/2012  DISCHARGE DIAGNOSES:  Chronic obstructive pulmonary disease exacerbation, acute-on-chronic respiratory failure needing oxygen, acute delirium due to infection, include anxiety disorder, diabetes, hypertension, hyperlipidemia, pansinusitis.   CODE STATUS ON DISCHARGE: FULL CODE.  MEDICATIONS ON DISCHARGE: Clonazepam 1 mg oral tablet, 2 tablets once a day in the morning and 1 mg in the evening, methylphenidate 20 mg oral tablet, 2 tablets orally once a day, Effexor, extended-release 150 mg oral capsule once a day, Ritalin 10 mg oral tablet once as needed, Symbicort 2 puffs inhaled 2 times a day, prednisone 10 mg oral tablets, start with 60 mg and taper x 10 mg daily until complete, prazosin 1 mg oral capsule 2 times a day, trazodone 100 mg tablet once a day at bedtime, metformin 1000 mg oral tablet 2 times a day, insulin 25 units subcutaneous once a day at bedtime, risperidone 0.5 mg oral tablet 2 times a day for 5 days, nicotine 21 mg patch transdermal once a day, Spiriva 18 mcg inhalation capsule once a day.   HOME OXYGEN ON DISCHARGE: Yes, 3 liters nasal cannula.   DIET ON DISCHARGE: Low-sodium, carbohydrate-controlled ADA diet, regular consistency.   ACTIVITY: As tolerated.   TIMEFRAME TO FOLLOWUP: With PMD in 1 to 2 weeks, and advised to follow with  Dr. Meredeth Ide in 2 to 4 weeks. Advised to keep a record of blood sugar level daily. If anything is less than 100 then advised to keep the next dose of insulin and speak to the doctor's office. He may not need insulin for long-term, and otherwise advised to taper oxygen as tolerated.   HISTORY OF PRESENT ILLNESS: A 59 year old Caucasian male, morbidly obese, past medical history of COPD and was still smoking, hypertension, hyperlipidemia, diabetes, chronic nasal polyp presented to the ER with a chief complaint of  shortness of breath, reporting that he has been short of breath for the past 2 to 3 days. Slowly it has been getting worse. It was reported that every spring his nasal polyp acts up and he becomes very short of breath. He was also having some cough and tightness in his chest. "He has some wheezing also." He was unable to speak full sentences. He was put on continuous nebulizer treatments, Solu-Medrol 125 mg IV, and chest x-ray revealed no acute finding. He was admitted with a diagnosis of COPD exacerbation. On presentation, his ABG was a PH of 7.35, pCO2 was 45, and pO2 was 50.   HOSPITAL COURSE AND STAY: He was hypoxic and was wheezing and using accessory respiratory muscles and placed on BiPAP. Initially he was a little confused, and remained on BiPAP for 1 to 2 days. He continued remaining hypoxic and confused, and then because of that he became delirious and he became very agitated and wanted to leave the hospital, pulled out his oxygen and his daily monitoring wires. Psychiatry was called in and he was started on involuntary commitment and started on trazodone, and later on Risperdal. On his COPD he had then a gradual and slow improvement in his status with steroids, antibiotics and nebulizer treatments. Finally he was able to tolerate 3 liters oxygen via nasal cannula and had a very good improvement in quiet breathing, so he was discharged home with oxygen.   Other medical issues addressed during the hospital stay:  1.  Hypertension: Blood pressure was elevated on admission due  to respiratory failure, but then blood pressure came down and remained normal.  2.  Diabetes: Blood glucose was increased. Checked his HbA1c which was also high. Started him on Lantus, which was new for the patient, and discharged with that but advised him strictly that this might not be permanent. He might not need oxygen forever, so he might not need Lantus forever. Has to check his blood sugar level and until he is on steroids,  tapering dose he might need, but after that his insulin requirement might go down, so has to follow with primary care physician. Explained to his wife in detail.  3.  Hyperlipidemia: Continued statin.  4.  Polycythemia: H and H  remained stable.  5. Pansinusitis: We continued antibiotics for his COPD, and that will also help him with his pansinusitis.  6. Confusion: As mentioned above he was very agitated. Psychiatry consult was called in and they put him on IVC. He had a slow, but gradual, improvement in his confusion and continued on Risperdal.   CONSULTATIONS DURING THE HOSPITALIZATION: Dr. Meredeth IdeFleming for pulmonary,  Dr. Toni Amendlapacs for psychiatry.   LAB RESULTS IN THE HOSPITAL: On presentation creatinine was 0.91. Sodium was 134, potassium 3.6. Creatinine remained stable in the hospital. LFTs were within normal range. Troponin was negative, less than 0.02. CBCs were within normal range except a mild rising of WBCs.  Chest x-ray on admission: No acute cardiopulmonary disease.   Echocardiogram was done for his excessive shortness of breath, which showed left ventricular ejection fraction 60%, normal global left ventricular systolic function, impaired relaxation pattern of LV diastolic filling, mild left ventricular hypertrophy, aortic valve sclerosis, without stenosis.   CT of the chest for pulmonary embolism was also done for hypoxia. There was no evidence of acute pulmonary embolism; no evidence of CHF, and enlarged subcarinal as well as right hilar lymph node. pleural or pericardial effusions, scattered 1 to 2 mm in diameter nodules in both lungs, and they were stable since September 2002 CT test.   CT of the head was done due to his confusion, consistent with pansinusitis inflammation. There was also a soft tissue density present in the left nasal passage, predominantly inflammatory, possibly polyposis. No evidence of acute or old ischemic or hemorrhagic event. No intracranial mass.   Repeat chest  x-ray was also unremarkable.  Total time spent on this discharge: Forty-five minutes.      ____________________________ Hope PigeonVaibhavkumar G. Elisabeth PigeonVachhani, MD vgv:dm D: 07/23/2012 08:14:29 ET T: 07/23/2012 08:56:38 ET JOB#: [id number]  cc: Hope PigeonVaibhavkumar G. Elisabeth PigeonVachhani, MD, <Dictator> Herbon E. Meredeth IdeFleming, MD Rhona LeavensJames F. Burnett ShengHedrick, MD Altamese DillingVAIBHAVKUMAR Mahala Rommel MD ELECTRONICALLY SIGNED 07/26/2012 22:17

## 2014-08-05 NOTE — H&P (Signed)
PATIENT NAME:  Curtis Russell, Curtis Russell MR#:  [id number] DATE OF BIRTH:  1955-12-02  DATE OF ADMISSION:  10/14/2012  PRIMARY CARE PHYSICIAN: Rhona Leavens. Burnett Sheng, MD  PRIMARY PULMONOLOGIST:  Ned Clines, MD   REQUESTING PHYSICIAN: Maricela Bo, MD    CHIEF COMPLAINT: Shortness of breath.   HISTORY OF PRESENT ILLNESS: The patient is a 59 year old obese male with a known history of COPD, hypertension, type 2 diabetes, is being admitted for acute on chronic respiratory failure likely due to COPD exacerbation with pneumonia. The patient was here in the hospital in April of 2014 for the same, but he was doing well for the last 2 to 3 months, had 2 sons who were sick with viral illness with common cold, URI and stomach virus, and he started catching the same yesterday with productive cough, shortness of breath and fever. He was also feeling really dizzy and fatigued. He was producing clear mucus with the coughing, was also feeling nauseous, and as his symptoms started getting worse he decided to come to the Emergency Department. While in the ED, he was found to have a temperature of 102.5. He was tachycardic with a rate of 117 per minute with a respiration of 40 per minute and was found to have left lower lobe pneumonia on chest x-ray. The patient was weaned off oxygen at home, but he required BiPAP.  He was followed by Dr. Meredeth Ide as an outpatient and was taken off oxygen about a month ago. The patient feels uncomfortable while on BiPAP and is still wheezing and is being admitted for further evaluation and management.   PAST MEDICAL HISTORY: 1.  COPD with ongoing smoking.  2.  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  3.  Hypertension.  4.  Hyperlipidemia.  5.  Type 2 diabetes.   PAST SURGICAL HISTORY: 1.  Hernia repair. 2.  Ganglion cyst removal.   ALLERGIES: BEE STINGS.  PSYCHOSOCIAL HISTORY: He lives at home with his wife, smokes 1-1/2 to 2 packs of cigarettes daily for a long time. Occasional intake of wine.  Denies any evidence of abuse. 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 He does not wear a mask. He works in his Chirop[employer] and has a high humidity.   FAMILY HISTORY: Mother had history of CHF and COPD.  Father with diabetes and CHF.   MEDICATIONS AT HOME: 1.  Albuterol 2 puffs inhaled 4 times a day as needed.  2.  Clonazepam 1 mg p.o. at bedtime.  3.  Hydrochlorothiazide/lisinopril 25/20 mg 1 tablet p.o. daily.  4.  Metformin 2000 mg p.o. b.i.d.  5.  Methylphenidate 20 mg p.o. daily in the morning and 1/2 tablet in the afternoon as needed.  6.  Symbicort 2 puffs inhaled twice a day.  7.  Trazodone 200 mg p.o. at bedtime.   REVIEW OF SYSTEMS:  CONSTITUTIONAL: Positive for fever, fatigue, weakness.  EYES: No blurred or double vision.  ENT: No tinnitus or ear pain.  RESPIRATORY:  Positive for productive cough with clear mucus, also wheezing and shortness of breath.  Has a known history of COPD with ongoing smoking.  CARDIOVASCULAR: No chest pain, orthopnea, edema.  GASTROINTESTINAL: Positive for nausea.  No vomiting or diarrhea.  GENITOURINARY: No dysuria or hematuria.  ENDOCRINE: No polyuria or nocturia.  HEMATOLOGY: No anemia or easy bruising.  SKIN: No rash or lesion.  MUSCULOSKELETAL: No arthritis or muscle cramps.  NEUROLOGICAL: No tingling, numbness, weakness. Positive for some mental status changes  with likely delirium. He is very agitated.  PSYCHIATRIC:  Positive for anxiety and acute delirium on last admission.   PHYSICAL EXAMINATION: VITAL SIGNS: Temperature 102.5, heart rate 117 per minute, respirations 40 per minute, blood pressure 179/91 mmHg.  He was saturating 94% on 3 liters oxygen via nasal cannula and was placed on BiPAP while in the ED.   GENERAL: The patient is a 59 year old morbidly obese male lying in the bed in acute respiratory distress.   HEENT:   Eyes:  Pupils are equal, round, reactive to light and accommodation. No  scleral icterus. Extraocular muscles are intact .  Head:  Atraumatic, normocephalic. Oropharynx and nasopharynx clear.  NECK:  Supple.  No jugular venous distention.  No thyroid enlargement or tenderness.   LUNGS: Decreased breath sounds at the bases. Expiratory wheezing throughout both lungs.  He is not using any accessory muscles of respiration. He does not have any labored breathing, either.  CARDIOVASCULAR: Tachycardic. No murmurs, rubs or gallops. S1, S2 normal.  ABDOMEN: Soft, obese. No organomegaly appreciated. Bowel sounds present.  EXTREMITIES: No pedal edema, cyanosis or clubbing.  NEUROLOGICAL:  Cranial nerves II through XII are intact. Muscle strength 5 out of 5 in all extremities.  Sensory intact.   PSYCHIATRIC: The patient is alert and oriented x 3. He has a normal mood and affect.  MUSCULOSKELETAL: No joint effusion. No peripheral edema.  SKIN: No obvious rash, lesion or ulcer.   LABORATORY AND RADIOLOGICAL DATA:  Normal BMP. Normal liver function tests. Normal CBC except white count of 18.7, platelets of 140.  Chest x-ray in the ED showed left lower lobe infiltrate.  EKG showing sinus tachycardia, incomplete right bundle branch block. No major ST-T changes.   IMPRESSION AND PLAN: 1.  Acute on chronic respiratory failure:  Likely due to COPD exacerbation with underlying pneumonia. We will start him on DuoNeb breathing treatments, steroids IV, consult Pulmonary, Dr. Meredeth IdeFleming, and use BiPAP as needed.  2.  Left lower lobe pneumonia:  We will start him on IV Levaquin, obtain sputum and blood cultures x 2.  3.  Chronic obstructive pulmonary disease exacerbation:  We will start him on IV steroids, Duo-Neb with Symbicort and Spiriva.  4.  Acute delirium:  Likely due to ongoing medical issues. We will also add low-dose Risperdal, which he required last time also, and consider psychiatric consult if not better.  5.  Possible sepsis:  With  fever, tachycardia, tachypnea, leukocytosis,  likely due to pneumonia and ongoing COPD exacerbation. Will monitor.  6.  Tobacco abuse: He was counseled for over 3 minutes. He does request nicotine patch, which we will order.   CODE STATUS:  FULL CODE.  The patient prefers not to get admitted to the CCU unless really needed.   TIME SPENT:   Total time taking care of this patient (critical care) is 55 minutes.  ____________________________ Ellamae SiaVipul S. Sherryll BurgerShah, MD vss:cb D: 10/14/2012 21:55:03 ET T: 10/14/2012 23:26:09 ET JOB#: [id number]  cc: Irbin Fines S. Sherryll BurgerShah, MD, <Dictator> Rhona LeavensJames F. Burnett ShengHedrick, MD Herbon E. Meredeth IdeFleming, MD Ellamae SiaVIPUL S [facility] MD ELECTRONICALLY SIGNED 10/15/2012 16:12

## 2014-08-05 NOTE — Consult Note (Signed)
PATIENT NAME:  Curtis Russell, Curtis Russell MR#:  [id number] DATE OF BIRTH:  September 14, 1955  DATE OF CONSULTATION:  07/15/2012  REFERRING PHYSICIAN: Ramonita Lab, MD   CONSULTING PHYSICIAN:  Jonthan Leite B. Lennyn Gange, MD  REASON FOR CONSULTATION: To evaluate an agitated patient.   IDENTIFYING DATA: The patient is a 59 year old male with history of depression and anxiety.   CHIEF COMPLAINT: "I'm better now."  HISTORY OF PRESENT ILLNESS: The patient has a lifetime history of anxiety and depression. He has been treated by his primary care provider with a combination of Effexor, clonazepam and Ritalin.  He reports that when physically better his psychiatric symptoms have been under control. However, since he lost his mother in July of last year in tragic circumstances, he has been troubled by nightmares and flashbacks with frequent crying spells.  He, however, denies depressive symptoms, stating on numerous occasions that he feels happy with his wife and family, but thoughts of his mother's tragic death are difficult for him to handle. He tried to obtain counseling from a [place] counselors' group but was unable to establish connection yet. He reports that it is very difficult for him to wake up every day and not be able to call his mother. She has been his best friend.  Reportedly, she had a quadruple bypass and returned home feeling well. The patient spent the day with her, went home for the night as she wished him to do, and in the morning found her very sick. He started CPR under the guidance of a 911 dispatcher, crushed her ribs and was unable to save her life. He makes a comment that in the past he was able to save the life of his child using skills that he learned in the [employer]. He was admitted to [facility] for exacerbation of chronic obstructive pulmonary disease and was admitted to the CCU.  He became agitated and unruly, and a psychiatrist was called for evaluation. At the time of my assessment,  the patient was doing well. He was alert and oriented.  He did not remember much about the circumstances of his admission to the hospital and it troubled him. He did not remember his children but reported feeling better every minute. He was compliant with treatment and ready to stay in the hospital as long as it is necessary for him to get better.  He realizes that he continues to smoke, and this makes his COPD much worse. He lists 3 vices cigarettes, Coke and sex with his wife. He denies alcohol, prescription drugs or illicit substance use. He could not explain to me why he was on a combination of Klonopin and a stimulant.  It makes very little sense.  Also, the stimulants make anxiety worse. He does report a history of panic attacks since childhood.   PAST PSYCHIATRIC HISTORY: He now believes that he has always been anxious. He remembers his attempts to stay home and not go to school. He has been treated for depression and anxiety by his primary doctor for many, many years. Overall, he is rather happy with the results of treatment.  As above, he is now seeking counseling for symptoms related to PTSD stemming from his mother's tragic death.   FAMILY PSYCHIATRIC HISTORY: His mother was depressed and anxious. They both realize that his grandfather most likely suffered mental illness as they recalled his strange behaviors.   PAST MEDICAL HISTORY:  1. COPD.  2. Hypertension.  3. Dyslipidemia.  4. Diabetes.   ALLERGIES: No  known drug allergies.   MEDICATIONS ON ADMISSION: Trazodone 50 mg at night, Symbicort 160/4.5 mg twice daily, Ritalin 40 mg daily, metformin 500 mg twice daily, hydrochlorothiazide/lisinopril 25/20 mg daily, clonazepam 2 mg in the morning, 1 mg at bedtime, Effexor-XR 150 mg daily.   MEDICATIONS AT THE TIME OF CONSULTATION: Metformin 500 mg twice daily, Solu-Medrol 60 mg IV push every 6 hours, Zantac 150 mg twice daily, Effexor XR 150 mg daily, Levaquin 750 mg daily, Tussionex 5 mg  twice daily as needed, Klonopin 2 mg in the morning, 1 in the evening, Mucinex 600 mg twice daily, Lantus 15 units in the morning, sliding scale insulin, Ativan injection 1 mg as needed, nicotine patch.   SOCIAL HISTORY: The patient is married. He lives with his wife. He has "lovely children."  He is disabled.  He had some career with the [employer], but it not certain what role he played or whether or not he has participated in combat.   REVIEW OF SYSTEMS:   CONSTITUTIONAL: No fevers or chills. Positive for gradual weight gain.  EYES: No double or blurred vision.  ENT: No hearing losses.  RESPIRATORY: Positive for shortness of breath. He is breathing oxygen.  CARDIOVASCULAR: No chest pain or orthopnea.  GASTROINTESTINAL: No abdominal pain, nausea, vomiting or diarrhea.   GENITOURINARY: No incontinence or frequency.  ENDOCRINE: No heat or cold intolerance.  LYMPHATIC: No anemia or easy bruising.  INTEGUMENTARY: No acne or rash.  MUSCULOSKELETAL: No muscle or joint pain.  NEUROLOGIC: No tingling or weakness.  PSYCHIATRIC: See history of present illness for details.   PHYSICAL EXAMINATION: VITAL SIGNS: Blood pressure 144/90, pulse 98, respirations 31, temperature 98.2.  GENERAL: This is an obese male in no acute distress.  The rest of the physical examination is deferred to his primary attending.   LABORATORY DATA: Chemistries are within normal limits except for blood glucose of 215.  LFTs are within normal limits. Cardiac enzymes are negative. CBC within normal limits, low platelets of 133.   MENTAL STATUS EXAMINATION: At the time of my assessment in the CCU, the patient is alert and oriented to person, place, time and somewhat to situation, although he does not remember the details of his admission. He is pleasant, polite and cooperative, rather playful and somewhat giddy, maybe dramatic a little bit. He is in bed. He is a wearing hospital gown. His grooming is poor. There is a strong body  odor.  He is breathing oxygen. He maintains good eye contact. His speech is of normal rhythm, rate and volume. His mood is fine with full affect. Thought processing seems logical and goal oriented. Thought content: He adamantly denies any suicidal or homicidal ideation. There are no delusions or paranoia. There are no auditory or visual hallucinations. His cognition is grossly intact. His insight and judgment seem adequate.  After I completed my interview, several hours later I was called by a CCU nurse who told me that the patient pulled out all his IVs and was trying to sneak out of the CCU following his wife, who just completed a visit with him.  He insisted to go home and demanded to be discharged. The nurse also reported that the patient has periods of confusion and inappropriate agitated and sexualized behavior.   SUICIDE RISK ASSESSMENT: This is a patient with a long history of depression and severe anxiety who most likely experiences delirium.  He is not capable of planning and executing a suicide attempt but puts himself in danger by  attempts to stop treatment and leave the hospital.   DIAGNOSES: AXIS I:  1. Major depressive disorder, recurrent, moderate. 2. Panic disorder with agoraphobia.  3. Posttraumatic stress disorder, chronic.  4. Delirium secondary to medical condition, medications, most likely steroids.   AXIS II: Deferred.   AXIS III:  1. Acute exacerbation of chronic obstructive pulmonary disease.  2. Hypertension.  3. Dyslipidemia.  4. Diabetes.   AXIS IV: Mental and physical illness.   AXIS V: Global Assessment of Functioning score 25.   PLAN:  1. We placed this patient on involuntary inpatient psychiatric commitment. I will order a sitter.  2. The patient does not have the capacity to make decisions about his disposition at this point.  3. We will continue Effexor, and we will continue Klonopin 1 mg 3 times a day and trazodone 100 mg for sleep.  4. We will not  continue stimulants as they are contraindicated in a patient with a panic disorder.  Post-traumatic stress disorder: The patient complains of severe nightmares and flashbacks. We will offer Minipress.  I will start at 1 mg twice daily.  If his blood pressure allows, we would like to increase the dose to 2 or even 4 mg twice daily.  5. We will attempt to find a Librarian, academic in the community to follow up with the patient. 6. Delirium, likely due to medical condition and medications.  Some of his symptoms are suggestive of maybe a manic episode precipitated by prednisone.  There is not really history of bipolar disorder in the family or in his personal  history.  We will start low dose Risperdal for delirium. He may need to be placed on a mood stabilizer eventually.  7. We will follow up.  ____________________________ Braulio Conte B. Jennet Maduro, MD jbp:cb D: 07/15/2012 19:33:40 ET T: 07/15/2012 20:08:50 ET JOB#: [id number]  cc: Joandry Slagter B. Jennet Maduro, MD, <Dictator> Shari Prows MD ELECTRONICALLY SIGNED 07/27/2012 21:39

## 2014-08-05 NOTE — Consult Note (Signed)
PATIENT NAME:  Curtis Russell, Curtis Russell MR#:  [id number] DATE OF BIRTH:  1955-08-13  DATE OF CONSULTATION:  07/19/2012  CONSULTING PHYSICIAN:  Audery AmelJohn T. Sherry Rogus, MD  IDENTIFYING INFORMATION AND REASON FOR CONSULT: A 59 year old man with a history of COPD, polycythemia and a history of anxiety and depression who is currently in the hospital for a COPD exacerbation. His breathing was so bad that he had to be in the critical care unit for a while. He is now out on the floor, but still he is having to use oxygen and is on steroids. Follow-up consult is because the  patient continued to have confusion.   HISTORY OF PRESENT ILLNESS: Information is obtained from the patient and the chart. The patient tells me he is still feeling very confused. He uses terms such as, "I feel like I am in a fugue state." Upon clarification, what he seems to mean is that he still has poor memory for much of his hospital stay. He cannot remember the episode that happened in the critical care unit very well. He feels like he is not getting an explanation for his medical problems. The patient denies acute depression, denies suicidal or homicidal ideation. He denies that he has been having any hallucinations in the hospital. He tells me that he has vague memory of coming into the hospital and now has memories of short-term that he is making and a memory all day of today but feels foggy for much of the time prior to that.   PAST PSYCHIATRIC HISTORY: The patient is a rambling historian, and at times it is somewhat difficult to put together all of the history. Some of this may be from confusion, but I think he also tends to exaggerate and be a bit grandiose in some of his descriptions. It appears that he has been diagnosed with some kind of anxiety disorder but has primarily received treatment from his primary care doctors. He also has been diagnosed with attention deficit hyperactivity disorder although it is not clear if perhaps that diagnosis was  arrived at by he himself. He is maintained on Ritalin by his primary care doctor as well. Most recently he has been on a combination of Ritalin, clonazepam and Effexor. Dr. Jennet MaduroPucilowska  got a significant history of possible posttraumatic stress disorder from the patient, although he never mentioned anything about it when I was talking to him. There is no history of suicide attempts. No history of violence. There appear to be no prior psychiatric hospitalizations.   PAST MEDICAL HISTORY: The patient is currently in the hospital for a COPD exacerbation. He claims that he was only vaguely aware of having lung disease, although the old chart indicates it has been a severe problem for a long time. Also, he has a history of polycythemia.   SOCIAL HISTORY: The patient is married, has adult children. He says that he has a very good relationship with his wife. Beyond that, details of his social history are sometimes hard to pin down.  For quite a bit of the interview, he talked as though he were actively working as a Civil engineer, contracting"master plasterer." Eventually, however, it came out that he had not done any work whatsoever in 2 to 3 years. It is not entirely clear whether this is disability physically or if he has just not been able to maintain work.   REVIEW OF SYSTEMS: His chief complaint is of  recent memory gaps. He denies depression, has mild chronic anxiety. He denies suicidal or homicidal  ideation. He denies hallucinations. He has been able to eat well and is not feeling nauseous. He minimizes his shortness of breath, although it is clearly documented that he gets out of breath just walking short distances. He denies that he is having any chest pain.   MENTAL STATUS EXAM: A somewhat disheveled gentleman, although it sounds like he is probably better cleaned up than when he was when Dr. Jennet Maduro saw him several days ago. He was sitting up in bed and was cooperative with the interview. He made good eye contact. Normal  psychomotor activity. Speech was normal in tone and rate. Thoughts appeared to be rambling at times. He has a tendency to tell vague but long stories. Nevertheless, none of sounded like they were clearly psychotic. It is possible he may be confabulating at times.  It is a little hard to tell. To me, it sounds a bit more like narcissistic grandiosity. He denies hallucinations, denies suicidal or homicidal ideation. Intelligence appears to be normal. Insight and judgment are questionable. Short-term memory and probably long-term memory impaired.   ASSESSMENT: A 59 year old man who several days ago was delirious in the CCU, pulling out IV lines, attempting to escape from the CCU. Today he is much calmer. He  does not appear to be manic and does not appear to be depressed. He is concerned about the fact that he has some memory gaps but does not appear to be necessarily delirious right now. He has a history of treatment with an odd combination of psychiatric medicines representing probably confusion about diagnosis. However, he had been stable on them in the past.   TREATMENT PLAN: I reassured the patient that given the severity of his illness, as well as the medications he was on, it is quite understandable that he would have become delirious. I reassured him that this did not mean that he was losing his mind and that he could expect to return to his baseline mental status once he was fully recovered physically. We discussed the idea of delirium, supportive therapy done around his continued outpatient treatment for his anxiety. No indication for psychiatric hospitalization. I think we can go ahead and cut back on the Risperdal that was started a couple of days ago. I am cutting it down to 0.5 mg twice a day. We will follow up.   DIAGNOSIS, PRINCIPAL AND PRIMARY:  AXIS I: Delirium secondary to chronic obstructive pulmonary disease exacerbation and other medical problems, currently resolving.   SECONDARY  DIAGNOSES: AXIS I: Depression, not otherwise specified, posttraumatic stress disorder by history, history of attention deficit/hyperactivity disorder.   AXIS II: Possibly some narcissistic traits.   AXIS III: Chronic obstructive pulmonary disease, polycythemia, obesity.   AXIS IV: Severe from chronically being out of work and ill.   AXIS V: Functioning at time of evaluation 50.   ____________________________ Audery Amel, MD jtc:cb D: 07/19/2012 22:29:00 ET T: 07/19/2012 22:51:36 ET JOB#: [id number]  cc: Audery Amel, MD, <Dictator> Audery Amel MD ELECTRONICALLY SIGNED 07/20/2012 9:56

## 2014-08-05 NOTE — Discharge Summary (Signed)
PATIENT NAME:  Curtis Russell, Curtis Russell MR#:  [id number] DATE OF BIRTH:  10-Apr-1956  DATE OF ADMISSION:  10/14/2012 DATE OF DISCHARGE:  10/19/2012  REASON FOR ADMISSION: Shortness of breath.   PRIMARY CARE PHYSICIAN:  Jerl Mina, MD  PRIMARY PULMONOLOGIST:  Ned Clines, MD   FINAL DIAGNOSES: 1.  Acute on chronic respiratory failure.  2.  Pneumonia, community-acquired left lower lobe pneumonia.  3.  Chronic obstructive pulmonary disease exacerbation.  4.  Uncontrolled diabetes.  5.  Acute encephalopathy due to hypoxia.  6.  Increased white blood cells due to steroids and sepsis.  7.  Tobacco abuse.  8.  Hypokalemia.  9.  Mild thrombocytopenia due to consumption.   IMPORTANT RESULTS:  1. The first white blood count was 18.7, went up to 20,000, at discharge 10,000.  2.  Hemoglobin 14.1, platelet count 151, dropped to 144 and then bumped back up again.  3.  Glucose was 189, creatinine was 1, potassium was 3.4.  4.  Blood cultures were negative x2. 5.  ABG showed oxygen saturation of 76% , a pH of 7.4, pCO2 of 40.   EKG: Sinus tachycardia.   OTHER DIAGNOSES INCLUDE:   Systemic inflammatory response syndrome with evidence of increased white blood cells, tachycardia and fever of 102.   MEDICATIONS AT DISCHARGE:   1.  Symbicort 160/4.5 twice daily. 2. Clonazepam 1 mg once a day at bedtime. 3.  Metformin 500 mg, take 4 tablets twice daily. 4.  Methylphenidate 20 mg, take 1 tablet in the morning and 0.5 in the afternoon as needed. 5.  Trazodone 50 mg, take 4 tablets once a day at bedtime. 6.  Hydrochlorothiazide with lisinopril 25/20 mg once daily. 7.  Albuterol 2 puffs 4 times a day. 8.  Prednisone taper starting at 40 mg, decrease by 10 every 2 days.  9.  Spiriva 18 mcg once a day. 10.  Levofloxacin 750 mg once a day for another eight days. 11.  DuoNeb at home.   HOSPITAL COURSE: Curtis Russell is a very nice 59 year old gentleman who has history of obesity, chronic obstructive pulmonary  disease, hypertension, type 2 diabetes. He is admitted for treatment of chronic respiratory failure due to the chronic obstructive pulmonary disease, and also a left lower lobe pneumonia.   The patient was in the hospital back in April with the same problem. The patient was doing very well up until he caught what he called was a cold or upper respiratory infection that was given by one of his sons.   The patient had significant productive cough and was starting to have significant problems catching his own breath. He felt really dizzy and fatigued. He was having some clear mucus with cough but getting worse progressively.   The patient showed up to the Emergency Department with a temperature of 102, he was tachycardic at 117 per minute and tachypneic with 40 times a minute respiratory rate.  His x-ray showed left lower lobe pneumonia. He is a patient of Dr. Meredeth Ide for what he was consulted.   PROBLEMS:  1.  Left lower lobe pneumonia. The patient was treated with Levaquin. The patient improved significantly. He has oxygen at home and he uses it sporadically. At this moment, we are recommending him to go home on 2 liters of oxygen, as he has it anyway at home and his oxygen saturation has been borderline 90 to 95.  2.  The patient had blood cultures that were negative. Overall, he did well. The patient says  that he has his pneumonia shot updated and his flu shot updated.  3.  As far as acute on chronic respiratory failure. This was due to the combination of factors, pneumonia and chronic obstructive pulmonary disease. Again, the patient has oxygen at home and we recommended to use it. 4.  Chronic obstructive pulmonary disease exacerbation. The patient was put on IV steroids, DuoNeb, Symbicort and Spiriva.  His IV steroids were tapered down all the way to a prednisone oral taper. The patient had increased white blood cells, increased blood sugar secondary to that. His glucose was treated with insulin  sliding scale, but the patient is going to go home only on his current medications as he is not willing to get on insulin 5.  Acute delirium. Apparently, the patient had significant delirium during this episode and that was likely secondary to his hypoxia and sepsis.  6.  Systemic inflammatory response syndrome/sepsis. The patient had fever, tachycardia, tachypnea and leukocytosis. The patient was put on broad-spectrum antibiotics, discharged on Levaquin for completion of his treatment of 14 days.  7.  Tobacco abuse. The patient was counseled. He is not interested in quitting at this moment.   I spent about 45 minutes with this discharge.   ____________________________ Felipa Furnaceoberto Sanchez Gutierrez, MD rsg:dp D: 10/23/2012 07:26:00 ET T: 10/23/2012 09:39:08 ET JOB#: [id number]  cc: Rhona LeavensJames F. Burnett ShengHedrick, MD Herbon E. Meredeth IdeFleming, MD Felipa Furnaceoberto Sanchez Gutierrez, MD, <Dictator>     Gae Bihl Juanda ChanceSANCHEZ GUTIERRE MD ELECTRONICALLY SIGNED 10/25/2012 13:10

## 2014-08-05 NOTE — Consult Note (Signed)
Brief Consult Note: Diagnosis: delirium due to hypoxia and steroids - resolving.   Patient was seen by consultant.   Orders entered.   Comments: Psychiatry: Patient seen. Follow up on prior consult by Dr Jennet MaduroPucilowska on 07-15-12. Patient today is awake and alert and showing little sign of current confusion and no longer appears actively delirious. Spent some time explaining to him the way delirium can happen and reassuring him that he should expect full recovery. Will cut the risperdal dose in half to 0.5 mg bid in hopeful anticipation of being able to stop it prior to discharge.  Electronic Signatures: Audery Amellapacs, Torris House T (MD)  (Signed 06-Apr-14 19:14)  Authored: Brief Consult Note   Last Updated: 06-Apr-14 19:14 by Audery Amellapacs, Camil Wilhelmsen T (MD)

## 2014-08-06 NOTE — Consult Note (Signed)
Psychiatry: Follow-up for this patient with anxiety.  He reports that today he is feeling better.  Reading is easier.  This helps him to feel less panicky.  No new complaints. of systems continues to have some shortness of breath but that is getting better.  Strength is improved.  No suicidality no psychotic symptoms. mental status he is awake alert oriented and appropriately interactive.  Good eye contact normal psychomotor activity.  Fully oriented intact short and long-term memory intact judgment and insight.  Supportive counseling and review of discharge plan.  Continue current venlafaxine dose.  Patient should be given information about private providers of psychiatric and therapy services at discharge.  Electronic Signatures: Birdie Beveridge, Jackquline DenmarkJohn T (MD)  (Signed on 13-Dec-15 13:17)  Authored  Last Updated: 13-Dec-15 13:17 by Audery Amellapacs, Estrella Alcaraz T (MD)

## 2014-08-06 NOTE — Consult Note (Signed)
Brief Consult Note: Diagnosis: PNA, Acute on chronic resp failure, pleurtic chest pain.   Patient was seen by consultant.   Consult note dictated.   Recommend further assessment or treatment.   Orders entered.   Discussed with Attending MD.   Comments: Cont current abx pending cx from sputum WIll give one dose advil to see if helps his pleurtic pain Consider a dose or two of lasix as his interstitial prominence on CXR may be volume related- checking bnp.  Electronic Signatures: Dierdre HarnessFitzgerald, Dayne Dekay Patrick (MD)  (Signed 11-Dec-15 15:55)  Authored: Brief Consult Note   Last Updated: 11-Dec-15 15:55 by Dierdre HarnessFitzgerald, Azura Tufaro Patrick (MD)

## 2014-08-06 NOTE — Discharge Summary (Signed)
PATIENT NAME:  Curtis Russell, Curtis Russell MR#:  [id number] DATE OF BIRTH:  1956/03/14  DATE OF ADMISSION:  03/21/2014 DATE OF DISCHARGE:  03/28/2014  PRESENTING COMPLAINT: Shortness of breath and cough.   DISCHARGE DIAGNOSES: 1.  Acute on chronic respiratory failure with pneumonitis and mild pulmonary edema, improved.  2.  Chronic respiratory failure, on home oxygen.  3.  Hypertension.  4.  Type 2 diabetes.  5.  Morbid obesity.   CODE STATUS: FULL code.   DISCHARGE MEDICATIONS: 1.  Symbicort 160/4.5 two puffs b.i.d.  2.  Trazodone 50 mg 4 tablets once a day at bedtime.  3.  Albuterol 2 puffs 4 times a day as needed.  4.  Aspirin 81 mg daily.  5.  Hydrochlorothiazide/lisinopril 12.5/20 two tablets once a day.  6.  Diphenhydramine 25 mg 1 tablet at bedtime.  7.  Metformin 500 mg 2 tablets b.i.d.  8.  Methylphenidate 20 mg 1 tablet b.i.d.  9.  Methylphenidate 10 mg 1 tablet once a day in the afternoon as needed.  10.  Venlafaxine 150 mg extended release p.o. daily.  11.  Simvastatin 20 mg at bedtime.  12.  Invokana 300 mg 1 tablet daily.  13.  Prednisone taper as directed.  14.  Levaquin 750 mg p.o. daily.  15.  Tessalon Perles 100 mg every 6 hours.  16.  Nasal cannula oxygen, 4 liters.   DISCHARGE DIET: Low sodium, carbohydrate-controlled diet.   DISCHARGE FOLLOWUP: Follow up with Dr. Burnett ShengHedrick in 1 to 2 weeks.  CONSULTATIONS: Infectious disease with Dr. Clydie Braunavid Fitzgerald.   DIAGNOSTIC DATA: Creatinine at discharge was 0.94. White count is 12.1, H and H 13 and 40.4.   Ultrasound of the abdomen showed advanced hepatic steatosis, otherwise unremarkable abdominal ultrasound.  Influenza A plus B negative.   Sputum culture appears to be normal flora at 48 hours.   Magnesium was 2.2.   BRIEF SUMMARY OF HOSPITAL COURSE: Curtis Russell is a 59 year old Caucasian gentleman with past medical history of tobacco abuse, on chronic home oxygen, comes in with:  1.  Acute on chronic respiratory failure  due to COPD exacerbation with underlying sepsis from bilateral pneumonia. The patient was initially placed on BiPAP. He was started on IV broad-spectrum antibiotics with Levaquin and Zosyn. His influenza was negative. His antibiotics were changed to p.o. Lasix and the patient remained on 4 liters nasal cannula; his home requirement. Sputum was negative. The patient also received some Lasix for possible interstitial edema and he felt much better thereafter. He diuresed well. He is euvolemic, does not require any long-term Lasix.  2.  Constipation. Resolved.  3.  Pneumonia with worsening pneumonitis. Received IV antibiotics with Zosyn and Levaquin. Received some Lasix for diuresis as well. The patient will complete p.o. Levaquin course as outpatient.  4.  Acute renal failure. Resolved with IV fluids.  5.  Generalized anxiety disorder. Continued home medications. The patient was started on Effexor by Dr. Toni Amendlapacs.  6.  Tobacco abuse. The patient was counseled on smoking cessation.   Hospital stay otherwise remained stable. He will follow up with Dr. Burnett ShengHedrick as outpatient.   TIME SPENT: 40 minutes.  ____________________________ Wylie HailSona A. Allena KatzPatel, MD sap:sb D: 03/29/2014 07:06:06 ET T: 03/29/2014 10:30:50 ET JOB#: [id number]  cc: Vir Whetstine A. Allena KatzPatel, MD, <Dictator> Rhona LeavensJames F. Burnett ShengHedrick, MD Willow OraSONA A Jefry Lesinski MD ELECTRONICALLY SIGNED 04/08/2014 17:01

## 2014-08-06 NOTE — Consult Note (Signed)
Brief Consult Note: Diagnosis: anxiety disorder nos.   Patient was seen by consultant.   Consult note dictated.   Orders entered.   Comments: Psychiatry: PAtient seen and chart reviewed. Chronic anxiety disorder. Not suicidal. Already on effexor 300mg  daily. Will adjust to correct dose. Neeeds referral to outpt treatment. Will consult SW.  Electronic Signatures: Audery Amellapacs, Darnelle Derrick T (MD)  (Signed 09-Dec-15 18:42)  Authored: Brief Consult Note   Last Updated: 09-Dec-15 18:42 by Audery Amellapacs, Sahith Nurse T (MD)

## 2014-08-06 NOTE — H&P (Signed)
PATIENT NAME:  Curtis Russell, Curtis Russell MR#:  [id number] DATE OF BIRTH:  08/20/1955  DATE OF ADMISSION:  03/21/2014  PRIMARY CARE PHYSICIAN:  Dr. Jerl MinaJames Hedrick.    PULMONARY:   Dr. Ned ClinesHerbon Fleming.   EMERGENCY DEPARTMENT PHYSICIAN:  Dr. Phineas SemenGraydon Goodman.   CHIEF COMPLAINT: Shortness of breath.   HISTORY OF PRESENT ILLNESS: The patient is a 59 year old male with a known history of COPD, hypertension, who is being admitted for acute on chronic respiratory failure likely due to COPD exacerbation with underlying bilateral pneumonia. The patient has been having upper respiratory tract infection symptoms since Friday with cough, fever, and difficulty breathing. He has also been having significant left upper quadrant pain from constant coughing and his pain goes up to 7-8 out of 10, especially when he coughs, otherwise it is about 2-3.  He denies any nausea, vomiting, diarrhea, or any other symptoms.  He has been feeling really sick and was having trouble breathing over the weekend and decided to come to the Emergency Department. While in the ED he underwent chest x-ray and subsequently CT of the chest which showed bilateral pneumonia, he is being admitted for further evaluation and management. He looked critically sick when I saw him in the Emergency Department with drenching sweats, sitting upright as he was not able to catch breath, and using accessory muscles of respiration. He is being admitted for further evaluation and management.   PAST MEDICAL HISTORY:   1.  COPD with ongoing smoking.  2.  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  3.  Hypertension.  4.  Hyperlipidemia.  5.  Type 2 diabetes.   PAST SURGICAL HISTORY:  1.  Hernia repair.  2.  Ganglion cyst removal.   ALLERGIES: BEE STINGS.   SOCIAL HISTORY: Smokes about 1-1/2 to 2 packs cigarettes daily for a long time. Occasional intake of wine. He lives at home with his wife. He works as an Customer service managerarchitect holding molding ornaments and also reports heavy exposure  to dust. He does not wear a mask. 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   FAMILY HISTORY: Positive for CHF and COPD in the mother, father has diabetes and heart failure.   MEDICATIONS AT HOME:  1. Venlafaxine 150 mg p.o. daily.  2. Albuterol 2 puffs inhaled 4 times a day as needed.  3. Aspirin 81 mg p.o. daily.  4. Diphenhydramine 25 mg p.o. at bedtime.  5. HCTZ/lisinopril 12.5/20 mg 2 tablets p.o. daily.  6. Invokana 300 mg p.o. daily.  7. Metformin 500 mg 2 tablets p.o. b.i.d.  8. Methylphenidate 10 mg p.o. daily in afternoon and 20 mg p.o. b.i.d.  9. Simvastatin 20 mg p.o. at bedtime.   10. Symbicort 2 puffs inhaled twice a day.  11. Trazodone 200 mg p.o. at bedtime.   REVIEW OF SYSTEMS:   CONSTITUTIONAL: Positive fever, fatigue, weakness.  EYES: No blurred or double vision.  EARS, NOSE, AND THROAT: No tinnitus or ear pain.  RESPIRATORY: Positive for cough, dry in nature.  Positive for wheezing, dyspnea, severe shortness of breath, and COPD with ongoing smoking.  CARDIOVASCULAR: Positive for chest pain likely from constant coughing.  No orthopnea or edema.  GASTROINTESTINAL: No nausea, vomiting, diarrhea.  GENITOURINARY:  No dysuria, hematuria.   ENDOCRINE: No polyuria or nocturia.  HEMATOLOGY: History of polycythemia.  No anemia or easy bruising.   SKIN: No rash or lesion.  MUSCULOSKELETAL: No arthritis or muscle cramp.  NEUROLOGIC: No tingling, numbness, weakness.  PSYCHIATRY: Positive for  anxiety. No history of depression.  PHYSICAL EXAMINATION:  VITAL SIGNS: Temperature 98.5, heart rate 119 per minute, respirations 26 per minute, blood pressure 133/77, he was saturating 92% on room air and 95% on 2 liters oxygen nasal cannula.   GENERAL:  The patient is a 59 year old male lying in bed, in acute respiratory distress.  EYES: Pupils equal, round, reactive to light and accommodation. No scleral icterus. Extraocular muscles intact.  HEENT: Head  atraumatic, normocephalic. Oropharynx and nasopharynx clear.  NECK: Supple.  No jugular venous distention or thyroid enlargement.  LUNGS: Decreased breath sounds at the bases bilaterally, wheezing throughout both lungs, rhonchi at the bases.  CARDIOVASCULAR: S1, S2 normal, tachycardic. No murmurs, rubs, or gallop.  ABDOMEN: Soft, obese, nontender, nondistended. Bowel sounds present. No organomegaly or masses. He does have minimal tenderness on the left upper quadrant mainly from constant coughing.  No obvious rigidity. No guarding. He is obese  EXTREMITIES: No pedal edema, cyanosis, or clubbing.  NEUROLOGIC: Cranial nerves II through XII are intact.  Muscle strength 5 out of 5 in extremities. Sensation intact.  PSYCHIATRIC: The patient is alert and oriented x 3.  SKIN: No obvious rash, lesion or ulcer.  MUSCULOSKELETAL: No joint effusion or tenderness.   LABORATORY DATA:  EKG shows normal sinus rhythm, no ST-T changes.   Laboratory work panel, normal BMP except creatinine of 1.39.  Normal liver function tests except alkaline phosphatase of 150. Cardiac enzyme was negative.   CBC within normal limits except white count of 17.6.   Chest x-ray in the ED shows mild interstitial edema. CT scan of the chest showed no PE, but new scattered bilateral airspace opacities worrisome for pneumonia.   IMPRESSION AND PLAN:  1.  Acute on chronic respiratory failure likely due to chronic obstructive pulmonary disease exacerbation with underlying bilateral pneumonia, likely multifocal.  We will get pulmonary consult with Dr. Dema Severin who is seeing him in the Emergency Department. We will continue him on Rocephin, Zithromax, and Levaquin. Zithromax mainly covers atypical organisms. We will get stat ABG as he does look critically sick and using accessory muscles for respiration. He may need intubation depending on his blood gas, may be okay on BiPAP.  2.  Sepsis present on admission clinically with leukocytosis,  tachycardia, tachypnea, and fever with source of infection been lungs. We will consider ID consult depending on his progress. 3.   Pneumonia likely multifocal, cannot rule out atypical organisms. We will start him on above antibiotics, he does look quite toxic, could be septic. Will obtain 2 sets of blood cultures and sputum culture.  We will check chlamydia, mycoplasma, and legionella antigen.  We will also get a flu test check. Pulmonary consult as above.  4.  Acute renal failure likely prerenal. We will hold diuretics at this time and monitor with gentle hydration, be cautious with too much fluid as he does have mild pulmonary edema pattern on chest x-ray.  5.  Diabetes. We will monitor him on sliding scale for now. As he will be on steroids certainly this could get worse, will adjust as needed.  6.  Code status, full code.   TOTAL TIME TAKING CARE OF THIS PATIENT: 55 minutes critical care. He remains at very high risk for cardiorespiratory failure.  He will be monitored in the CCU.     ____________________________ Birl Lobello S. Sherryll Burger, MD vss:bu D: 03/21/2014 18:36:37 ET T: 03/21/2014 19:31:59 ET JOB#: [id number]  cc: Evalina Tabak S. Sherryll Burger, MD, <Dictator> Patricia Pesa MD ELECTRONICALLY SIGNED  03/25/2014 13:11 

## 2014-08-06 NOTE — Consult Note (Signed)
Psychiatry: Follow-up for this patient with a history of anxiety.  Patient reports that physically he is still not feeling well.  Has a lot of pain especially when he coughs.  As far as his anxiety he says he still feels nervous but is not having panic attacks nearly as bad as before he came into the hospital.  His mood is nervous but he doesn't feel extremely depressed.  He denies suicidal ideation and denies hallucinations.mental status exam he is awake and alert and cooperative.  Makes good eye contact.  Normal psychomotor activity.  Speech normal rate tone and volume.  Affect euthymic.  No evidence of delusions.  Denies hallucinations denies suicidal or homicidal ideation.  Short and long-term memory intact judgment and insight intact. is tolerating medicine.  Symptoms are improving.  Supportive therapy completed.  Patient agrees that he needs to be seeing a psychotherapist and mental health provider after he leaves the hospital.  No change to current dose of Effexor.  Diagnosis of chronic anxiety disorder with panic as major feature.  Possible PTSD.  Electronic Signatures: Clapacs, Jackquline DenmarkJohn T (MD)  (Signed on 12-Dec-15 12:42)  Authored  Last Updated: 12-Dec-15 12:42 by Audery Amellapacs, John T (MD)

## 2015-01-11 ENCOUNTER — Ambulatory Visit (INDEPENDENT_AMBULATORY_CARE_PROVIDER_SITE_OTHER): Payer: BLUE CROSS/BLUE SHIELD | Admitting: General Surgery

## 2015-01-11 ENCOUNTER — Encounter: Payer: Self-pay | Admitting: General Surgery

## 2015-01-11 VITALS — BP 144/78 | HR 96 | Resp 16 | Ht 69.6 in | Wt 260.0 lb

## 2015-01-11 DIAGNOSIS — L02211 Cutaneous abscess of abdominal wall: Secondary | ICD-10-CM

## 2015-01-11 NOTE — Patient Instructions (Signed)
The patient is aware to call back for any questions or concerns.  

## 2015-01-11 NOTE — Progress Notes (Signed)
Patient ID: Curtis Russell, male   DOB: 1955/05/24, 59 y.o.   MRN: [id number]  Chief Complaint  Patient presents with  . Cyst    infected    HPI Curtis Russell is a 59 y.o. male.  Here today for evaluation of an infected cyst on his abdomen. States he first noticied it 12 days ago. Located in his lower left abdomen. He states it is black in color and painful. He started the antibiotics on Monday and he does thinks it is some better. No drainage. He states it was opened up a little bit on Monday. States he does not recall having this happen in the past. The patient suffered a profound allergic reaction 2 years ago and by description had a cardiac arrest. 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 He is originally from [place], [place], his wife from [place].  HPI  Past Medical History  Diagnosis Date  . COPD (chronic obstructive pulmonary disease)   . Sleep apnea   . Hyperlipidemia   . Nasal polyp   . Pneumonia   . Pleurisy 2015  . Diabetes mellitus without complication 2014    Past Surgical History  Procedure Laterality Date  . Hernia repair  2001  . Hernia repair      bil inguinal  . Nasal sinus surgery      Family History  Problem Relation Age of Onset  . Diabetes Father   . COPD Mother     Social History Social History  Substance Use Topics  . Smoking status: Current Every Day Smoker -- 1.50 packs/day for 30 years    Types: Cigarettes  . Smokeless tobacco: Never Used  . Alcohol Use: No    Allergies  Allergen Reactions  . Bee Venom Anaphylaxis    Current Outpatient Prescriptions  Medication Sig Dispense Refill  . amoxicillin (AMOXIL) 875 MG tablet Take 875 mg by mouth 2 (two) times daily.    Marland Kitchen aspirin 81 MG tablet Take 81 mg by mouth daily.    . budesonide-formoterol (SYMBICORT) 160-4.5 MCG/ACT inhaler Inhale 2 puffs into the lungs 2 (two) times daily.    . canagliflozin (INVOKANA) 300 MG TABS tablet Take 300 mg by mouth daily before breakfast.     . diphenhydrAMINE (SOMINEX) 25 MG tablet Take 25 mg by mouth at bedtime as needed for sleep.    Marland Kitchen ipratropium-albuterol (DUONEB) 0.5-2.5 (3) MG/3ML SOLN Take 3 mLs by nebulization every 6 (six) hours as needed.    Marland Kitchen lisinopril-hydrochlorothiazide (PRINZIDE,ZESTORETIC) 20-12.5 MG tablet Take 2 tablets by mouth daily.    . metFORMIN (GLUCOPHAGE) 500 MG tablet Take 1,000 mg by mouth 2 (two) times daily with a meal.    . methylphenidate (RITALIN) 10 MG tablet Take 10 mg by mouth as needed.    . methylphenidate (RITALIN) 20 MG tablet Take 20 mg by mouth 2 (two) times daily with breakfast and lunch.    . simvastatin (ZOCOR) 20 MG tablet TAKE ONE TABLET BY MOUTH NIGHTLY *PATIENT NEEDS TO CALL THE OFFICE FOR AN APPOINTMENT*    . traZODone (DESYREL) 50 MG tablet Take 50 mg by mouth at bedtime.    . triamcinolone cream (KENALOG) 0.1 % Apply 1 application topically 2 (two) times daily.    Marland Kitchen venlafaxine XR (EFFEXOR-XR) 150 MG 24 hr capsule Take 150 mg by mouth daily with breakfast.     No current facility-administered medications for this visit.    Review of Systems Review of Systems  Constitutional: Negative.  Respiratory: Negative.   Cardiovascular: Negative.     Blood pressure 144/78, pulse 96, resp. rate 16, height 5' 9.6" (1.768 m), weight 260 lb (117.935 kg).  Physical Exam Physical Exam  Constitutional: He is oriented to person, place, and time. He appears well-developed and well-nourished.  HENT:  Mouth/Throat: Oropharynx is clear and moist.  Eyes: Conjunctivae are normal. No scleral icterus.  Neck: Neck supple.  Cardiovascular: Normal rate, regular rhythm and normal heart sounds.   Pulmonary/Chest: Effort normal. He has wheezes in the right lower field.  Abdominal:    Lymphadenopathy:    He has no cervical adenopathy.  Neurological: He is alert and oriented to person, place, and time.  Skin: Skin is warm and dry.  3 x 4 cm area LLQ on abdominal ponticulus   Psychiatric: His  behavior is normal.    Data Reviewed PCP notes  Assessment    Abdominal wall abscess in a diabetic.    Plan    Incision and drainage was recommended and accepted. The skin was cleansed with Betadine followed by 10 mL of 0.5% Xylocaine with 0.25% Marcaine with 1-200,000 of epinephrine. The area was reprepped with Betadine and a 2 cm ellipse of skin was excised. Small amount of bloody drainage. No gross pus. Underlying chronic granulation tissue bluntly debrided. Dry dressing applied.  The patient will shower daily, may apply peroxide and Neosporin as desired. Continue his present course of antibiotic. These will be adjusted based on culture obtained today.      Follow up in one week with the nurse. Complete antibioticsPhysician follow-up will be determined based on nursing exam next week.   PCP:  Jerl Mina Ref: Larna Daughters NP   Earline Mayotte 01/11/2015, 10:48 AM

## 2015-01-17 LAB — ANAEROBIC AND AEROBIC CULTURE: Result 1: NEGATIVE — AB

## 2015-01-18 ENCOUNTER — Ambulatory Visit (INDEPENDENT_AMBULATORY_CARE_PROVIDER_SITE_OTHER): Payer: BLUE CROSS/BLUE SHIELD | Admitting: *Deleted

## 2015-01-18 DIAGNOSIS — L02211 Cutaneous abscess of abdominal wall: Secondary | ICD-10-CM

## 2015-01-18 NOTE — Progress Notes (Signed)
Patient came in today for a wound check.  The wound is clean, with no signs of infection or no drainage. Follow up as needed. Patient states he has a skin tag on his left thigh and when he walks the area rubs together. Can we make a appt for removal?

## 2015-03-02 ENCOUNTER — Ambulatory Visit: Payer: BLUE CROSS/BLUE SHIELD | Attending: Specialist

## 2015-03-02 ENCOUNTER — Other Ambulatory Visit: Payer: Self-pay | Admitting: Specialist

## 2015-03-02 DIAGNOSIS — J441 Chronic obstructive pulmonary disease with (acute) exacerbation: Secondary | ICD-10-CM | POA: Insufficient documentation

## 2015-03-02 LAB — BLOOD GAS, ARTERIAL
Acid-base deficit: 2.5 mmol/L — ABNORMAL HIGH (ref 0.0–2.0)
Allens test (pass/fail): POSITIVE — AB
BICARBONATE: 21.8 meq/L (ref 21.0–28.0)
FIO2: 0.21
O2 SAT: 95.9 %
PCO2 ART: 36 mmHg (ref 32.0–48.0)
PO2 ART: 82 mmHg — AB (ref 83.0–108.0)
Patient temperature: 37
pH, Arterial: 7.39 (ref 7.350–7.450)

## 2016-08-16 ENCOUNTER — Emergency Department: Payer: BLUE CROSS/BLUE SHIELD

## 2016-08-16 ENCOUNTER — Inpatient Hospital Stay
Admission: EM | Admit: 2016-08-16 | Discharge: 2016-08-19 | DRG: 190 | Disposition: A | Discharge status: Home / Self Care | Payer: BLUE CROSS/BLUE SHIELD | Attending: Internal Medicine | Admitting: Internal Medicine

## 2016-08-16 DIAGNOSIS — J441 Chronic obstructive pulmonary disease with (acute) exacerbation: Secondary | ICD-10-CM | POA: Diagnosis present

## 2016-08-16 DIAGNOSIS — J440 Chronic obstructive pulmonary disease with acute lower respiratory infection: Secondary | ICD-10-CM | POA: Diagnosis present

## 2016-08-16 DIAGNOSIS — R000 Tachycardia, unspecified: Secondary | ICD-10-CM | POA: Diagnosis present

## 2016-08-16 DIAGNOSIS — E785 Hyperlipidemia, unspecified: Secondary | ICD-10-CM | POA: Diagnosis present

## 2016-08-16 DIAGNOSIS — F17210 Nicotine dependence, cigarettes, uncomplicated: Secondary | ICD-10-CM | POA: Diagnosis present

## 2016-08-16 DIAGNOSIS — Z7951 Long term (current) use of inhaled steroids: Secondary | ICD-10-CM

## 2016-08-16 DIAGNOSIS — J9621 Acute and chronic respiratory failure with hypoxia: Secondary | ICD-10-CM | POA: Diagnosis present

## 2016-08-16 DIAGNOSIS — J209 Acute bronchitis, unspecified: Secondary | ICD-10-CM | POA: Diagnosis present

## 2016-08-16 DIAGNOSIS — Z9981 Dependence on supplemental oxygen: Secondary | ICD-10-CM | POA: Diagnosis not present

## 2016-08-16 DIAGNOSIS — F17200 Nicotine dependence, unspecified, uncomplicated: Secondary | ICD-10-CM

## 2016-08-16 DIAGNOSIS — E119 Type 2 diabetes mellitus without complications: Secondary | ICD-10-CM | POA: Diagnosis present

## 2016-08-16 DIAGNOSIS — Z79899 Other long term (current) drug therapy: Secondary | ICD-10-CM | POA: Diagnosis not present

## 2016-08-16 DIAGNOSIS — Z91030 Bee allergy status: Secondary | ICD-10-CM

## 2016-08-16 DIAGNOSIS — Z7984 Long term (current) use of oral hypoglycemic drugs: Secondary | ICD-10-CM

## 2016-08-16 DIAGNOSIS — Z7982 Long term (current) use of aspirin: Secondary | ICD-10-CM | POA: Diagnosis not present

## 2016-08-16 DIAGNOSIS — I10 Essential (primary) hypertension: Secondary | ICD-10-CM | POA: Diagnosis present

## 2016-08-16 DIAGNOSIS — G4733 Obstructive sleep apnea (adult) (pediatric): Secondary | ICD-10-CM | POA: Diagnosis present

## 2016-08-16 LAB — BASIC METABOLIC PANEL
Anion gap: 12 (ref 5–15)
BUN: 22 mg/dL — AB (ref 6–20)
CHLORIDE: 101 mmol/L (ref 101–111)
CO2: 22 mmol/L (ref 22–32)
CREATININE: 1.13 mg/dL (ref 0.61–1.24)
Calcium: 9.2 mg/dL (ref 8.9–10.3)
GFR calc non Af Amer: >60 mL/min (ref >60)
Glucose, Bld: 115 mg/dL — ABNORMAL HIGH (ref 65–99)
Potassium: 3.8 mmol/L (ref 3.5–5.1)
SODIUM: 135 mmol/L (ref 135–145)

## 2016-08-16 LAB — MRSA PCR SCREENING: MRSA by PCR: NEGATIVE

## 2016-08-16 LAB — CBC WITH DIFFERENTIAL/PLATELET
BASOS PCT: 1 %
Basophils Absolute: 0.1 10*3/uL (ref 0–0.1)
EOS ABS: 0.2 10*3/uL (ref 0–0.7)
Eosinophils Relative: 1 %
HEMATOCRIT: 48.2 % (ref 40.0–52.0)
Hemoglobin: 16.6 g/dL (ref 13.0–18.0)
LYMPHS ABS: 1.3 10*3/uL (ref 1.0–3.6)
Lymphocytes Relative: 8 %
MCH: 29.7 pg (ref 26.0–34.0)
MCHC: 34.4 g/dL (ref 32.0–36.0)
MCV: 86.3 fL (ref 80.0–100.0)
MONO ABS: 1.5 10*3/uL — AB (ref 0.2–1.0)
MONOS PCT: 9 %
Neutro Abs: 13.9 10*3/uL — ABNORMAL HIGH (ref 1.4–6.5)
Neutrophils Relative %: 81 %
Platelets: 166 10*3/uL (ref 150–440)
RBC: 5.58 MIL/uL (ref 4.40–5.90)
RDW: 14.4 % (ref 11.5–14.5)
WBC: 17.1 10*3/uL — ABNORMAL HIGH (ref 3.8–10.6)

## 2016-08-16 LAB — TROPONIN I: Troponin I: <0.03 ng/mL (ref <0.03)

## 2016-08-16 LAB — EXPECTORATED SPUTUM ASSESSMENT W GRAM STAIN, RFLX TO RESP C

## 2016-08-16 LAB — PROCALCITONIN: PROCALCITONIN: 0.32 ng/mL

## 2016-08-16 LAB — MAGNESIUM: Magnesium: 2.1 mg/dL (ref 1.7–2.4)

## 2016-08-16 LAB — GLUCOSE, CAPILLARY
GLUCOSE-CAPILLARY: 188 mg/dL — AB (ref 65–99)
GLUCOSE-CAPILLARY: 329 mg/dL — AB (ref 65–99)

## 2016-08-16 LAB — EXPECTORATED SPUTUM ASSESSMENT W REFEX TO RESP CULTURE: SPECIAL REQUESTS: NORMAL

## 2016-08-16 MED ORDER — LISINOPRIL 20 MG PO TABS
40.0000 mg | ORAL_TABLET | Freq: Every day | ORAL | Status: DC
  Administered 2016-08-17 – 2016-08-19 (×3): 40 mg via ORAL
  Filled 2016-08-16 (×3): qty 2

## 2016-08-16 MED ORDER — ALBUTEROL SULFATE (2.5 MG/3ML) 0.083% IN NEBU
2.5000 mg | INHALATION_SOLUTION | RESPIRATORY_TRACT | Status: DC | PRN
  Filled 2016-08-16: qty 3

## 2016-08-16 MED ORDER — DEXTROSE 5 % IV SOLN
500.0000 mg | Freq: Once | INTRAVENOUS | Status: AC
  Administered 2016-08-16: 500 mg via INTRAVENOUS
  Filled 2016-08-16: qty 500

## 2016-08-16 MED ORDER — LEVALBUTEROL HCL 1.25 MG/0.5ML IN NEBU
1.2500 mg | INHALATION_SOLUTION | Freq: Four times a day (QID) | RESPIRATORY_TRACT | Status: DC
  Administered 2016-08-17 (×2): 1.25 mg via RESPIRATORY_TRACT
  Filled 2016-08-16 (×2): qty 0.5

## 2016-08-16 MED ORDER — DEXTROSE 5 % IV SOLN
500.0000 mg | INTRAVENOUS | Status: DC
  Administered 2016-08-16: 500 mg via INTRAVENOUS
  Filled 2016-08-16 (×2): qty 500

## 2016-08-16 MED ORDER — DIPHENHYDRAMINE HCL (SLEEP) 25 MG PO TABS: 25.0000 mg | ORAL_TABLET | Freq: Every evening | ORAL | Status: DC | PRN

## 2016-08-16 MED ORDER — OXYCODONE HCL 5 MG PO TABS: 5.0000 mg | ORAL_TABLET | ORAL | Status: DC | PRN

## 2016-08-16 MED ORDER — INSULIN GLARGINE 100 UNIT/ML ~~LOC~~ SOLN
15.0000 [IU] | Freq: Every day | SUBCUTANEOUS | Status: DC
  Administered 2016-08-16 – 2016-08-18 (×3): 15 [IU] via SUBCUTANEOUS
  Filled 2016-08-16 (×5): qty 0.15

## 2016-08-16 MED ORDER — ASPIRIN 81 MG PO TABS: 81.0000 mg | ORAL_TABLET | Freq: Every day | ORAL | Status: DC

## 2016-08-16 MED ORDER — ONDANSETRON HCL 4 MG PO TABS: 4.0000 mg | ORAL_TABLET | Freq: Four times a day (QID) | ORAL | Status: DC | PRN

## 2016-08-16 MED ORDER — METHYLPREDNISOLONE SODIUM SUCC 125 MG IJ SOLR
125.0000 mg | Freq: Once | INTRAMUSCULAR | Status: AC
  Administered 2016-08-16: 125 mg via INTRAVENOUS
  Filled 2016-08-16: qty 2

## 2016-08-16 MED ORDER — ACETAMINOPHEN 650 MG RE SUPP: 650.0000 mg | Freq: Four times a day (QID) | RECTAL | Status: DC | PRN

## 2016-08-16 MED ORDER — ACETAMINOPHEN 325 MG PO TABS: 650.0000 mg | ORAL_TABLET | Freq: Four times a day (QID) | ORAL | Status: DC | PRN

## 2016-08-16 MED ORDER — SENNOSIDES-DOCUSATE SODIUM 8.6-50 MG PO TABS: 1.0000 | ORAL_TABLET | Freq: Every evening | ORAL | Status: DC | PRN

## 2016-08-16 MED ORDER — ONDANSETRON HCL 4 MG/2ML IJ SOLN: 4.0000 mg | Freq: Four times a day (QID) | INTRAMUSCULAR | Status: DC | PRN

## 2016-08-16 MED ORDER — IPRATROPIUM-ALBUTEROL 0.5-2.5 (3) MG/3ML IN SOLN
9.0000 mL | Freq: Once | RESPIRATORY_TRACT | Status: AC
  Administered 2016-08-16: 9 mL via RESPIRATORY_TRACT
  Filled 2016-08-16: qty 9

## 2016-08-16 MED ORDER — BISACODYL 5 MG PO TBEC
5.0000 mg | DELAYED_RELEASE_TABLET | Freq: Every day | ORAL | Status: DC | PRN
  Administered 2016-08-19: 5 mg via ORAL
  Filled 2016-08-16: qty 1

## 2016-08-16 MED ORDER — TRAZODONE HCL 100 MG PO TABS
200.0000 mg | ORAL_TABLET | Freq: Every day | ORAL | Status: DC
  Administered 2016-08-16 – 2016-08-18 (×3): 200 mg via ORAL
  Filled 2016-08-16: qty 4
  Filled 2016-08-16: qty 1
  Filled 2016-08-16: qty 4
  Filled 2016-08-16: qty 2

## 2016-08-16 MED ORDER — ASPIRIN EC 81 MG PO TBEC
81.0000 mg | DELAYED_RELEASE_TABLET | Freq: Every day | ORAL | Status: DC
  Administered 2016-08-17 – 2016-08-19 (×3): 81 mg via ORAL
  Filled 2016-08-16 (×3): qty 1

## 2016-08-16 MED ORDER — INSULIN ASPART 100 UNIT/ML ~~LOC~~ SOLN
0.0000 [IU] | Freq: Every day | SUBCUTANEOUS | Status: DC
  Administered 2016-08-16: 4 [IU] via SUBCUTANEOUS
  Filled 2016-08-16: qty 4

## 2016-08-16 MED ORDER — ENOXAPARIN SODIUM 40 MG/0.4ML ~~LOC~~ SOLN
40.0000 mg | SUBCUTANEOUS | Status: DC
  Administered 2016-08-16 – 2016-08-18 (×3): 40 mg via SUBCUTANEOUS
  Filled 2016-08-16 (×3): qty 0.4

## 2016-08-16 MED ORDER — ALBUTEROL SULFATE (2.5 MG/3ML) 0.083% IN NEBU: 7.5000 mg | INHALATION_SOLUTION | Freq: Once | RESPIRATORY_TRACT | Status: DC

## 2016-08-16 MED ORDER — SIMVASTATIN 20 MG PO TABS
20.0000 mg | ORAL_TABLET | Freq: Every day | ORAL | Status: DC
Start: 2016-08-16 — End: 2016-08-19
  Administered 2016-08-16 – 2016-08-18 (×3): 20 mg via ORAL
  Filled 2016-08-16 (×3): qty 1

## 2016-08-16 MED ORDER — METHYLPREDNISOLONE SODIUM SUCC 125 MG IJ SOLR
60.0000 mg | Freq: Four times a day (QID) | INTRAMUSCULAR | Status: DC
  Administered 2016-08-16 – 2016-08-17 (×3): 60 mg via INTRAVENOUS
  Filled 2016-08-16 (×3): qty 2

## 2016-08-16 MED ORDER — DIPHENHYDRAMINE HCL 25 MG PO CAPS: 25.0000 mg | ORAL_CAPSULE | Freq: Every evening | ORAL | Status: DC | PRN

## 2016-08-16 MED ORDER — HYDROCHLOROTHIAZIDE 25 MG PO TABS
25.0000 mg | ORAL_TABLET | Freq: Every day | ORAL | Status: DC
  Administered 2016-08-17 – 2016-08-19 (×3): 25 mg via ORAL
  Filled 2016-08-16 (×3): qty 1

## 2016-08-16 MED ORDER — LEVALBUTEROL HCL 1.25 MG/0.5ML IN NEBU
1.2500 mg | INHALATION_SOLUTION | Freq: Four times a day (QID) | RESPIRATORY_TRACT | Status: DC
  Administered 2016-08-16: 1.25 mg via RESPIRATORY_TRACT
  Filled 2016-08-16: qty 0.5

## 2016-08-16 MED ORDER — NICOTINE 14 MG/24HR TD PT24
14.0000 mg | MEDICATED_PATCH | Freq: Every day | TRANSDERMAL | Status: DC
  Administered 2016-08-16 – 2016-08-18 (×4): 14 mg via TRANSDERMAL
  Filled 2016-08-16 (×3): qty 1

## 2016-08-16 MED ORDER — INSULIN ASPART 100 UNIT/ML ~~LOC~~ SOLN
0.0000 [IU] | Freq: Three times a day (TID) | SUBCUTANEOUS | Status: DC
  Administered 2016-08-17: 2 [IU] via SUBCUTANEOUS
  Filled 2016-08-16: qty 2

## 2016-08-16 MED ORDER — LISINOPRIL-HYDROCHLOROTHIAZIDE 20-12.5 MG PO TABS: 2.0000 | ORAL_TABLET | Freq: Every day | ORAL | Status: DC

## 2016-08-16 MED ORDER — MAGNESIUM SULFATE 2 GM/50ML IV SOLN
2.0000 g | Freq: Once | INTRAVENOUS | Status: AC
  Administered 2016-08-16: 2 g via INTRAVENOUS
  Filled 2016-08-16: qty 50

## 2016-08-16 MED ORDER — GUAIFENESIN-DM 100-10 MG/5ML PO SYRP
5.0000 mL | ORAL_SOLUTION | ORAL | Status: DC | PRN
  Administered 2016-08-16 – 2016-08-19 (×4): 5 mL via ORAL
  Filled 2016-08-16 (×4): qty 5

## 2016-08-16 NOTE — ED Provider Notes (Signed)
[facility] Emergency Department Provider Note  ____________________________________________   First MD Initiated Contact with Patient 08/16/16 1314     (approximate)  I have reviewed the triage vital signs and the nursing notes.   HISTORY  Chief Complaint Shortness of Breath   HPI Curtis Russell is a 61 y.o. male with a history of COPD as well as pneumonia was presenting with 3 days worsening shortness of breath. He also reports a cough productive of rust colored sputum in the mornings. He says that the only pain he has a sharp pain when coughing at night in his chest. He does not report any pain at this time. Says that he has been taking his albuterol and ipratropium at home without relief.   Past Medical History:  Diagnosis Date  . COPD (chronic obstructive pulmonary disease)   . Diabetes mellitus without complication 2014  . Hyperlipidemia   . Nasal polyp   . Pleurisy 2015  . Pneumonia   . Sleep apnea     Patient Active Problem List   Diagnosis Date Noted  . Abdominal wall abscess 01/11/2015    Past Surgical History:  Procedure Laterality Date  . HERNIA REPAIR  2001  . HERNIA REPAIR     bil inguinal  . NASAL SINUS SURGERY      Prior to Admission medications   Medication Sig Start Date End Date Taking? Authorizing Provider  amoxicillin (AMOXIL) 875 MG tablet Take 875 mg by mouth 2 (two) times daily.    Historical Provider, MD  aspirin 81 MG tablet Take 81 mg by mouth daily.    Historical Provider, MD  budesonide-formoterol (SYMBICORT) 160-4.5 MCG/ACT inhaler Inhale 2 puffs into the lungs 2 (two) times daily.    Historical Provider, MD  canagliflozin (INVOKANA) 300 MG TABS tablet Take 300 mg by mouth daily before breakfast.    Historical Provider, MD  diphenhydrAMINE (SOMINEX) 25 MG tablet Take 25 mg by mouth at bedtime as needed for sleep.    Historical Provider, MD  ipratropium-albuterol (DUONEB) 0.5-2.5 (3) MG/3ML SOLN Take 3 mLs by  nebulization every 6 (six) hours as needed.    Historical Provider, MD  lisinopril-hydrochlorothiazide (PRINZIDE,ZESTORETIC) 20-12.5 MG tablet Take 2 tablets by mouth daily.    Historical Provider, MD  metFORMIN (GLUCOPHAGE) 500 MG tablet Take 1,000 mg by mouth 2 (two) times daily with a meal.    Historical Provider, MD  methylphenidate (RITALIN) 10 MG tablet Take 10 mg by mouth as needed.    Historical Provider, MD  methylphenidate (RITALIN) 20 MG tablet Take 20 mg by mouth 2 (two) times daily with breakfast and lunch.    Historical Provider, MD  simvastatin (ZOCOR) 20 MG tablet TAKE ONE TABLET BY MOUTH NIGHTLY *PATIENT NEEDS TO CALL THE OFFICE FOR AN APPOINTMENT* 01/10/15   Historical Provider, MD  traZODone (DESYREL) 50 MG tablet Take 50 mg by mouth at bedtime.    Historical Provider, MD  triamcinolone cream (KENALOG) 0.1 % Apply 1 application topically 2 (two) times daily.    Historical Provider, MD  venlafaxine XR (EFFEXOR-XR) 150 MG 24 hr capsule Take 150 mg by mouth daily with breakfast.    Historical Provider, MD    Allergies Bee venom  Family History  Problem Relation Age of Onset  . Diabetes Father   . COPD Mother     Social History Social History  Substance Use Topics  . Smoking status: Current Every Day Smoker    Packs/day: 1.50  Years: 30.00    Types: Cigarettes  . Smokeless tobacco: Never Used  . Alcohol use No    Review of Systems  Constitutional: No fever/chills Eyes: No visual changes. ENT: No sore throat. Cardiovascular: as above Respiratory: as above Gastrointestinal: No abdominal pain.  No nausea, no vomiting.  No diarrhea.  No constipation. Genitourinary: Negative for dysuria. Musculoskeletal: Negative for back pain. Skin: Negative for rash. Neurological: Negative for headaches, focal weakness or numbness.   ____________________________________________   PHYSICAL EXAM:  VITAL SIGNS: ED Triage Vitals [08/16/16 1302]  Enc Vitals Group     BP  (!) 144/70     Pulse Rate (!) 114     Resp 18     Temp 99.6 F (37.6 C)     Temp Source Oral     SpO2 92 %     Weight (P) 260 lb (117.9 kg)     Height      Head Circumference      Peak Flow      Pain Score      Pain Loc      Pain Edu?      Excl. in GC?     Constitutional: Alert and oriented. Working hard to breathe. Speaking in 2-3 word sentences. Eyes: Conjunctivae are normal. PERRL. EOMI. Head: Atraumatic. Nose: No congestion/rhinnorhea. Mouth/Throat: Mucous membranes are moist.   Neck: No stridor.   Cardiovascular:tachycardic, regular rhythm. Grossly  normal heart sounds Respiratory: Patient with increased work of breathing and using accessory muscles. Increased respiratory rate. He can to 3 word sentences. Diffuse wheezing with a prolonged story phase and decreased air movement throughout. Gastrointestinal: Soft and nontender. No distention.  Musculoskeletal: No lower extremity tenderness nor edema.  No joint effusions. Neurologic:  Normal speech and language. No gross focal neurologic deficits are appreciated. Skin:  Skin is warm, dry and intact. No rash noted. Psychiatric: Mood and affect are normal. Speech and behavior are normal.  ____________________________________________   LABS (all labs ordered are listed, but only abnormal results are displayed)  Labs Reviewed  CBC WITH DIFFERENTIAL/PLATELET - Abnormal; Notable for the following:       Result Value   WBC 17.1 (*)    Neutro Abs 13.9 (*)    Monocytes Absolute 1.5 (*)    All other components within normal limits  BASIC METABOLIC PANEL - Abnormal; Notable for the following:    Glucose, Bld 115 (*)    BUN 22 (*)    All other components within normal limits  TROPONIN I   ____________________________________________  EKG  ED ECG REPORT I, Arelia Longest, the attending physician, personally viewed and interpreted this ECG.   Date: 08/16/2016  EKG Time: 1306  Rate: 110  Rhythm: sinus tachycardia   Axis: normal  Intervals:none  ST&T Change: No ST segment elevation or depression. No abnormal T-wave inversion.  ____________________________________________  RADIOLOGY   ____________________________________________   PROCEDURES  Procedure(s) performed:   Procedures  Critical Care performed:  CRITICAL CARE Performed by: Arelia Longest   Total critical care time: 35 minutes  Critical care time was exclusive of separately billable procedures and treating other patients.  Critical care was necessary to treat or prevent imminent or life-threatening deterioration.  Critical care was time spent personally by me on the following activities: development of treatment plan with patient and/or surrogate as well as nursing, discussions with consultants, evaluation of patient's response to treatment, examination of patient, obtaining history from patient or surrogate, ordering and performing  treatments and interventions, ordering and review of laboratory studies, ordering and review of radiographic studies, pulse oximetry and re-evaluation of patient's condition.  ____________________________________________   INITIAL IMPRESSION / ASSESSMENT AND PLAN / ED COURSE  Pertinent labs & imaging results that were available during my care of the patient were reviewed by me and considered in my medical decision making (see chart for details).  Patient to be placed on BiPAP.    ----------------------------------------- 2:16 PM on 08/16/2016 -----------------------------------------  Patient tolerating the BiPAP well. Work of breathing is improved but still to Neck and wheezing throughout. Strong history cough. We will continue him on the BiPAP. Will also order a second round of nebs as well as magnesium. Patient to be admitted to the hospital. Signed out to Dr. Imogene Burnhen. Patient aware of the need for permission to the hospital one to  comply.  ____________________________________________   FINAL CLINICAL IMPRESSION(S) / ED DIAGNOSES  COPD exacerbation.    NEW MEDICATIONS STARTED DURING THIS VISIT:  New Prescriptions   No medications on file     Note:  This document was prepared using Dragon voice recognition software and may include unintentional dictation errors.    Myrna Blazeravid Matthew Joannie Medine, MD 08/16/16 [phone]

## 2016-08-16 NOTE — ED Notes (Addendum)
Pt resting in bed, Bipap on and tolerating well. Family at bedside.

## 2016-08-16 NOTE — ED Notes (Signed)
Pt placed on oxygen during triage.

## 2016-08-16 NOTE — ED Triage Notes (Signed)
Pt to ER via POV c/o sob X 3 days, worsening. Pt presents with tachypnea upon arrival, diaphoretic. Pt wears oxygen at night only.

## 2016-08-16 NOTE — H&P (Addendum)
[facility] [place] [facility]   PATIENT NAME: Curtis Russell    MR#:  [id number]  DATE OF BIRTH:  02/26/1956  DATE OF ADMISSION:  08/16/2016  PRIMARY CARE PHYSICIAN: Jerl MinaJames Hedrick, MD   REQUESTING/REFERRING PHYSICIAN: Myrna Blazeravid Matthew Schaevitz, MD  CHIEF COMPLAINT:   Chief Complaint  Patient presents with  . Shortness of Breath   Worsening cough, sputum, wheezing and shortness of breath for 3 days. HISTORY OF PRESENT ILLNESS:  Curtis Russell  is a 61 y.o. male with a known history of chronic respiratory failure on home oxygen 5 L, COPD, OSA, hyperlipidemia and diabetes.the patient was sent to the ED from home due to above chief complaints. He complains of fever and chills, cough with dark sputum but denies chest pain, palpitation, orthopnea or leg edema.chest x-ray didn't show any infiltrate. He was found, tachycardia, tachypnea and hypoxia (on 5 L oxygen), put on BiPAP, but he still has shortness breath.  PAST MEDICAL HISTORY:   Past Medical History:  Diagnosis Date  . COPD (chronic obstructive pulmonary disease)   . Diabetes mellitus without complication 2014  . Hyperlipidemia   . Nasal polyp   . Pleurisy 2015  . Pneumonia   . Sleep apnea     PAST SURGICAL HISTORY:   Past Surgical History:  Procedure Laterality Date  . HERNIA REPAIR  2001  . HERNIA REPAIR     bil inguinal  . NASAL SINUS SURGERY      SOCIAL HISTORY:   Social History  Substance Use Topics  . Smoking status: Current Every Day Smoker    Packs/day: 1.50    Years: 30.00    Types: Cigarettes  . Smokeless tobacco: Never Used  . Alcohol use No    FAMILY HISTORY:   Family History  Problem Relation Age of Onset  . Diabetes Father   . COPD Mother     DRUG ALLERGIES:   Allergies  Allergen Reactions  . Bee Venom Anaphylaxis    REVIEW OF SYSTEMS:   Review of Systems  Constitutional: Positive for chills, fever and malaise/fatigue.  HENT: Negative for congestion, sinus pain  and sore throat.   Eyes: Negative for blurred vision and double vision.  Respiratory: Positive for cough, sputum production, shortness of breath and wheezing. Negative for hemoptysis and stridor.   Cardiovascular: Negative for chest pain, palpitations and leg swelling.  Gastrointestinal: Negative for abdominal pain, blood in stool, diarrhea, melena, nausea and vomiting.  Genitourinary: Negative for dysuria and hematuria.       Difficult to urinate due to prostate problem.  Musculoskeletal: Negative for back pain.  Skin: Negative for itching and rash.  Neurological: Positive for dizziness and weakness. Negative for focal weakness, loss of consciousness and headaches.  Psychiatric/Behavioral: Negative for depression. The patient is not nervous/anxious.     MEDICATIONS AT HOME:   Prior to Admission medications   Medication Sig Start Date End Date Taking? Authorizing Provider  aspirin 81 MG tablet Take 81 mg by mouth daily.   Yes Historical Provider, MD  canagliflozin (INVOKANA) 300 MG TABS tablet Take 300 mg by mouth daily before breakfast.   Yes Historical Provider, MD  diphenhydrAMINE (SOMINEX) 25 MG tablet Take 25 mg by mouth at bedtime as needed for sleep.   Yes Historical Provider, MD  Dulaglutide (TRULICITY) 0.75 MG/0.5ML SOPN Inject 1 vial into the skin once a week.   Yes Historical Provider, MD  glipiZIDE (GLUCOTROL XL) 10 MG 24 hr tablet Take 10 mg by  mouth daily with breakfast.   Yes Historical Provider, MD  ipratropium-albuterol (DUONEB) 0.5-2.5 (3) MG/3ML SOLN Take 3 mLs by nebulization every 6 (six) hours as needed.   Yes Historical Provider, MD  lisinopril-hydrochlorothiazide (PRINZIDE,ZESTORETIC) 20-12.5 MG tablet Take 2 tablets by mouth daily.   Yes Historical Provider, MD  metFORMIN (GLUCOPHAGE) 500 MG tablet Take 1,000 mg by mouth 2 (two) times daily with a meal.   Yes Historical Provider, MD  PRESCRIPTION MEDICATION Pt part of a study at [facility] and uses two unknown inhalers as  part of the blind study.   Yes Historical Provider, MD  simvastatin (ZOCOR) 20 MG tablet TAKE ONE TABLET BY MOUTH NIGHTLY *PATIENT NEEDS TO CALL THE OFFICE FOR AN APPOINTMENT* 01/10/15  Yes Historical Provider, MD  traZODone (DESYREL) 50 MG tablet Take 200 mg by mouth at bedtime.    Yes Historical Provider, MD      VITAL SIGNS:  Blood pressure 124/69, pulse (!) 109, temperature 99.6 F (37.6 C), temperature source Oral, resp. rate (!) 36, weight 269 lb (122 kg), SpO2 97 %.  PHYSICAL EXAMINATION:  Physical Exam  GENERAL:  61 y.o.-year-old patient lying in the bed with no acute distress. Morbid obese.on BiPAP. EYES: Pupils equal, round, reactive to light and accommodation. No scleral icterus. Extraocular muscles intact.  HEENT: Head atraumatic, normocephalic. Oropharynx and nasopharynx clear.  NECK:  Supple, no jugular venous distention. No thyroid enlargement, no tenderness.  LUNGS: bilateral multiple with expiratory wheezing, no rales,rhonchi or crepitation. No use of accessory muscles of respiration.  CARDIOVASCULAR: S1, S2 normal. No murmurs, rubs, or gallops.  ABDOMEN: Soft, nontender, nondistended. Bowel sounds present. No organomegaly or mass.  EXTREMITIES: No pedal edema, cyanosis, or clubbing.  NEUROLOGIC: Cranial nerves II through XII are intact. Muscle strength 5/5 in all extremities. Sensation intact. Gait not checked.  PSYCHIATRIC: The patient is alert and oriented x 3.  SKIN: No obvious rash, lesion, or ulcer.   LABORATORY PANEL:   CBC  Recent Labs Lab 08/16/16 1320  WBC 17.1*  HGB 16.6  HCT 48.2  PLT 166   ------------------------------------------------------------------------------------------------------------------  Chemistries   Recent Labs Lab 08/16/16 1320  NA 135  K 3.8  CL 101  CO2 22  GLUCOSE 115*  BUN 22*  CREATININE 1.13  CALCIUM 9.2    ------------------------------------------------------------------------------------------------------------------  Cardiac Enzymes  Recent Labs Lab 08/16/16 1320  TROPONINI <0.03   ------------------------------------------------------------------------------------------------------------------  RADIOLOGY:  Dg Chest 1 View  Result Date: 08/16/2016 CLINICAL DATA:  Worsening shortness of breath for 3 days EXAM: CHEST 1 VIEW COMPARISON:  03/25/2014 FINDINGS: The heart size and mediastinal contours are within normal limits. Both lungs are clear. The visualized skeletal structures are unremarkable. IMPRESSION: No active disease. Electronically Signed   By: Elige Ko   On: 08/16/2016 14:00      IMPRESSION AND PLAN:   Acute on chronic respiratory failure with hypoxia due to COPD exacerbation and acute bronchitis. The patient will be admitted to stepdown unit. Continue BiPAP, IV Solu-Medrol, Xopenex every 6 hours, Zithromax, Robitussin when necessary. INTENSIVIST CONSULT.  Leukocytosis. Due to above. Follow CBC.  Diabetes. Hold her by mouth diabetes medication start basal lantus and sliding scale. Morbid obesity with OSA.Continue BiPAP. obacco abuse. Ation was counseled for 4 minutes, give nicotine patch.  I discussed with intensivist Dr. Park Breed. All the records are reviewed and case discussed with ED provider. Management plans discussed with the patient, his wife and they are in agreement.  CODE STATUS: full code.  TOTALcritical TIME TAKING  CARE OF THIS PATIENT: 63 minutes.    Shaune Pollack M.D on 08/16/2016 at 2:43 PM  Between 7am to 6pm - Pager - [phone]  After 6pm go to [email or web address] - Social research officer, government  [facility]  Office  [phone]  CC: Primary care physician; Jerl Mina, MD   Note: This dictation was prepared with Dragon dictation along with smaller phrase technology. Any transcriptional errors that result from this process  are unintentional.

## 2016-08-16 NOTE — Consult Note (Signed)
Name: Curtis Russell MRN: [id number] DOB: [birth year]    ADMISSION DATE:  08/16/2016 CONSULTATION DATE: 08/16/2016  REFERRING MD :  Dr. Imogene Burn   CHIEF COMPLAINT:  Shortness of Breath   BRIEF PATIENT DESCRIPTION:  61 yo male admitted 05/4 with acute on chronic hypoxic respiratory failure secondary to AECOPD and Acute Bronchitis   SIGNIFICANT EVENTS  05/4-Pt admitted to Stepdown Unit   STUDIES:  None   HISTORY OF PRESENT ILLNESS:   This is a 61 yo male with a PMH of OSA, Pneumonia, Pleurisy (2015), Nasal Polyp, Hyperlipidemia, Diabetes Mellitus, and COPD.  He presented to [facility] ER 05/4 with c/o worsening shortness breath onset 05/1.  Per ER notes the pt reported productive cough of rust colored sputum.  He also endorses having chest pain described as a sharp sensation worse when coughing.  He took his albuterol and ipratropium at home in an attempt to treat the symptoms, however symptoms did not improve he also stated he had a temp of 101 F prompting current ER visit.  Upon arrival to the ER it was noted he had increased work of breathing and wheezing throughout he was subsequently placed on continuous Bipap.  He was admitted to the [facility] by hospitalist for further workup and treatment   PAST MEDICAL HISTORY :   has a past medical history of COPD (chronic obstructive pulmonary disease); Diabetes mellitus without complication (2014); Hyperlipidemia; Nasal polyp; Pleurisy (2015); Pneumonia; and Sleep apnea.  has a past surgical history that includes Hernia repair (2001); Hernia repair; and Nasal sinus surgery. Prior to Admission medications   Medication Sig Start Date End Date Taking? Authorizing Provider  aspirin 81 MG tablet Take 81 mg by mouth daily.   Yes Historical Provider, MD  canagliflozin (INVOKANA) 300 MG TABS tablet Take 300 mg by mouth daily before breakfast.   Yes Historical Provider, MD  diphenhydrAMINE (SOMINEX) 25 MG tablet Take 25 mg by mouth at bedtime as needed for  sleep.   Yes Historical Provider, MD  Dulaglutide (TRULICITY) 0.75 MG/0.5ML SOPN Inject 1 vial into the skin once a week.   Yes Historical Provider, MD  glipiZIDE (GLUCOTROL XL) 10 MG 24 hr tablet Take 10 mg by mouth daily with breakfast.   Yes Historical Provider, MD  ipratropium-albuterol (DUONEB) 0.5-2.5 (3) MG/3ML SOLN Take 3 mLs by nebulization every 6 (six) hours as needed.   Yes Historical Provider, MD  lisinopril-hydrochlorothiazide (PRINZIDE,ZESTORETIC) 20-12.5 MG tablet Take 2 tablets by mouth daily.   Yes Historical Provider, MD  metFORMIN (GLUCOPHAGE) 500 MG tablet Take 1,000 mg by mouth 2 (two) times daily with a meal.   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   Yes Historical Provider, MD  simvastatin (ZOCOR) 20 MG tablet TAKE ONE TABLET BY MOUTH NIGHTLY *PATIENT NEEDS TO CALL THE OFFICE FOR AN APPOINTMENT* 01/10/15  Yes Historical Provider, MD  traZODone (DESYREL) 50 MG tablet Take 200 mg by mouth at bedtime.    Yes Historical Provider, MD   Allergies  Allergen Reactions  . Bee Venom Anaphylaxis    FAMILY HISTORY:  family history includes COPD in his mother; Diabetes in his father. SOCIAL HISTORY:  reports that he has been smoking Cigarettes.  He has a 45.00 pack-year smoking history. He has never used smokeless tobacco. He reports that he does not drink alcohol or use drugs.  REVIEW OF SYSTEMS: Positives in BOLD  Constitutional: Negative for fever, chills, weight loss, malaise/fatigue and diaphoresis.  HENT: Negative for hearing loss, ear pain, nosebleeds, congestion, sore throat, neck pain, tinnitus and ear discharge.   Eyes: Negative for blurred vision, double vision, photophobia, pain, discharge and redness.  Respiratory: cough, hemoptysis, sputum production, shortness of breath, wheezing and stridor.   Cardiovascular: chest pain, palpitations, orthopnea, claudication, leg  swelling and PND.  Gastrointestinal: Negative for heartburn, nausea, vomiting, abdominal pain, diarrhea, constipation, blood in stool and melena.  Genitourinary: Negative for dysuria, urgency, frequency, hematuria and flank pain.  Musculoskeletal: Negative for myalgias, back pain, joint pain and falls.  Skin: Negative for itching and rash.  Neurological: Negative for dizziness, tingling, tremors, sensory change, speech change, focal weakness, seizures, loss of consciousness, weakness and headaches.  Endo/Heme/Allergies: Negative for environmental allergies and polydipsia. Does not bruise/bleed easily.  SUBJECTIVE: Pt states he feels slightly better on Bipap   VITAL SIGNS: Temp:  [99.6 F (37.6 C)] 99.6 F (37.6 C) (05/04 1302) Pulse Rate:  [97-114] 101 (05/04 1700) Resp:  [18-36] 28 (05/04 1700) BP: (105-144)/(65-76) 129/76 (05/04 1700) SpO2:  [86 %-99 %] 99 % (05/04 1700) Weight:  [117.9 kg (260 lb)-122 kg (269 lb)] 122 kg (269 lb) (05/04 1305)  PHYSICAL EXAMINATION: General: well developed, well nourished male, NAD on Bipap Neuro: alert and oriented, follows commands HEENT: supple, no JVD Cardiovascular: nsr, s1s2, no M/R/G Lungs: rhonchi throughout, even, non labored  Abdomen: +BS x4, obese, soft, non tender, non distended Musculoskeletal: normal bulk and tone  Skin: intact no rashes or lesions    Recent Labs Lab 08/16/16 1320  NA 135  K 3.8  CL 101  CO2 22  BUN 22*  CREATININE 1.13  GLUCOSE 115*    Recent Labs Lab 08/16/16 1320  HGB 16.6  HCT 48.2  WBC 17.1*  PLT 166   Dg Chest 1 View  Result Date: 08/16/2016 CLINICAL DATA:  Worsening shortness of breath for 3 days EXAM: CHEST 1 VIEW COMPARISON:  03/25/2014 FINDINGS: The heart size and mediastinal contours are within normal limits. Both lungs are clear. The visualized skeletal structures are unremarkable. IMPRESSION: No active disease. Electronically Signed   By: Elige KoHetal  Patel   On: 08/16/2016 14:00     ASSESSMENT / PLAN: Acute on chronic hypoxic respiratory failure secondary to AECOPD and Acute Bronchitis Pleuritic Chest Pain  Hx: OSA, Pleurisy, Pneumonia, and Diabetes Mellitus P: Supplemental O2 to maintain 88% to 92% Intermittent CXR Scheduled and prn bronchodilator therapy IV steroids Continue abx Trend WBC's and monitor fever curve  Trend PCT's  Follow cultures  CBG's ac/hs, SSI, and lantus Continue outpatient simvastatin, aspirin, lisinopril, and hydrochlorothiazide  Prn Oxycodone for pain management  Lovenox for VTE prophylaxis   Curtis Russell, AGNP  Pulmonary/Critical Care Pager [phone] (please enter 7 [phone]) PCCM Consult Pager [phone] (please enter 7 digits)  PCCM ATTENDING ATTESTATION:  I have evaluated patient with the APP Russell, reviewed database in its entirety and discussed care plan in detail. In addition, this patient was discussed on multidisciplinary rounds.   Important exam findings: No overt distress on [place] O2 Scattered wheezes Reg, no M NABS, soft No LE edema  Major problems addressed by PCCM team: AECOPD Smoker DM2   PLAN/REC: As above Care plan has been carefully reviewed and modified by me Counseled re: smoking cessation    Billy Fischeravid Saiya Crist, MD PCCM service Mobile [phone] Pager [phone] 08/17/2016 4:11 PM

## 2016-08-16 NOTE — ED Notes (Signed)
EDP at bedside  

## 2016-08-17 DIAGNOSIS — J9621 Acute and chronic respiratory failure with hypoxia: Secondary | ICD-10-CM

## 2016-08-17 DIAGNOSIS — J441 Chronic obstructive pulmonary disease with (acute) exacerbation: Secondary | ICD-10-CM

## 2016-08-17 DIAGNOSIS — F17200 Nicotine dependence, unspecified, uncomplicated: Secondary | ICD-10-CM

## 2016-08-17 LAB — BASIC METABOLIC PANEL
Anion gap: 10 (ref 5–15)
BUN: 35 mg/dL — AB (ref 6–20)
CALCIUM: 8.8 mg/dL — AB (ref 8.9–10.3)
CHLORIDE: 102 mmol/L (ref 101–111)
CO2: 23 mmol/L (ref 22–32)
CREATININE: 1.22 mg/dL (ref 0.61–1.24)
Glucose, Bld: 212 mg/dL — ABNORMAL HIGH (ref 65–99)
Potassium: 3.9 mmol/L (ref 3.5–5.1)
SODIUM: 135 mmol/L (ref 135–145)

## 2016-08-17 LAB — CBC
HCT: 46 % (ref 40.0–52.0)
Hemoglobin: 15.3 g/dL (ref 13.0–18.0)
MCH: 29 pg (ref 26.0–34.0)
MCHC: 33.4 g/dL (ref 32.0–36.0)
MCV: 86.7 fL (ref 80.0–100.0)
PLATELETS: 162 10*3/uL (ref 150–440)
RBC: 5.3 MIL/uL (ref 4.40–5.90)
RDW: 14.5 % (ref 11.5–14.5)
WBC: 12.7 10*3/uL — AB (ref 3.8–10.6)

## 2016-08-17 LAB — GLUCOSE, CAPILLARY
GLUCOSE-CAPILLARY: 193 mg/dL — AB (ref 65–99)
GLUCOSE-CAPILLARY: 304 mg/dL — AB (ref 65–99)
Glucose-Capillary: 208 mg/dL — ABNORMAL HIGH (ref 65–99)
Glucose-Capillary: 321 mg/dL — ABNORMAL HIGH (ref 65–99)

## 2016-08-17 LAB — PROCALCITONIN: Procalcitonin: 0.27 ng/mL

## 2016-08-17 MED ORDER — METHYLPREDNISOLONE SODIUM SUCC 40 MG IJ SOLR
40.0000 mg | Freq: Two times a day (BID) | INTRAMUSCULAR | Status: DC
  Administered 2016-08-17 – 2016-08-19 (×4): 40 mg via INTRAVENOUS
  Filled 2016-08-17 (×4): qty 1

## 2016-08-17 MED ORDER — BUDESONIDE 0.25 MG/2ML IN SUSP
0.2500 mg | Freq: Four times a day (QID) | RESPIRATORY_TRACT | Status: DC
  Administered 2016-08-17 – 2016-08-19 (×8): 0.25 mg via RESPIRATORY_TRACT
  Filled 2016-08-17 (×8): qty 2

## 2016-08-17 MED ORDER — INSULIN ASPART 100 UNIT/ML ~~LOC~~ SOLN
0.0000 [IU] | Freq: Three times a day (TID) | SUBCUTANEOUS | Status: DC
  Administered 2016-08-17: 5 [IU] via SUBCUTANEOUS
  Administered 2016-08-17: 11 [IU] via SUBCUTANEOUS
  Administered 2016-08-18: 8 [IU] via SUBCUTANEOUS
  Administered 2016-08-18: 3 [IU] via SUBCUTANEOUS
  Administered 2016-08-18 – 2016-08-19 (×2): 8 [IU] via SUBCUTANEOUS
  Administered 2016-08-19: 5 [IU] via SUBCUTANEOUS
  Filled 2016-08-17: qty 11
  Filled 2016-08-17 (×2): qty 5
  Filled 2016-08-17: qty 8
  Filled 2016-08-17: qty 3
  Filled 2016-08-17 (×2): qty 8

## 2016-08-17 MED ORDER — METFORMIN HCL 500 MG PO TABS: 1000.0000 mg | ORAL_TABLET | Freq: Two times a day (BID) | ORAL | Status: DC

## 2016-08-17 MED ORDER — AZITHROMYCIN 500 MG PO TABS
500.0000 mg | ORAL_TABLET | Freq: Every day | ORAL | Status: DC
  Administered 2016-08-17 – 2016-08-19 (×3): 500 mg via ORAL
  Filled 2016-08-17 (×3): qty 1

## 2016-08-17 MED ORDER — IPRATROPIUM-ALBUTEROL 0.5-2.5 (3) MG/3ML IN SOLN
9.0000 mL | Freq: Four times a day (QID) | RESPIRATORY_TRACT | Status: DC
  Filled 2016-08-17: qty 9

## 2016-08-17 MED ORDER — INSULIN ASPART 100 UNIT/ML ~~LOC~~ SOLN
0.0000 [IU] | Freq: Every day | SUBCUTANEOUS | Status: DC
  Administered 2016-08-17: 4 [IU] via SUBCUTANEOUS
  Administered 2016-08-18: 3 [IU] via SUBCUTANEOUS
  Filled 2016-08-17: qty 4
  Filled 2016-08-17: qty 3

## 2016-08-17 MED ORDER — ALBUTEROL SULFATE (2.5 MG/3ML) 0.083% IN NEBU: 2.5000 mg | INHALATION_SOLUTION | RESPIRATORY_TRACT | Status: DC | PRN

## 2016-08-17 MED ORDER — CANAGLIFLOZIN 300 MG PO TABS: 300.0000 mg | ORAL_TABLET | Freq: Every day | ORAL | Status: DC

## 2016-08-17 MED ORDER — IPRATROPIUM-ALBUTEROL 0.5-2.5 (3) MG/3ML IN SOLN
3.0000 mL | Freq: Four times a day (QID) | RESPIRATORY_TRACT | Status: DC
  Administered 2016-08-17 – 2016-08-19 (×8): 3 mL via RESPIRATORY_TRACT
  Filled 2016-08-17 (×7): qty 3

## 2016-08-17 MED ORDER — ALPRAZOLAM 0.25 MG PO TABS
0.2500 mg | ORAL_TABLET | Freq: Four times a day (QID) | ORAL | Status: DC | PRN
  Administered 2016-08-17: 0.25 mg via ORAL
  Filled 2016-08-17: qty 1

## 2016-08-17 MED ORDER — BUDESONIDE 0.25 MG/2ML IN SUSP: 0.2500 mg | Freq: Four times a day (QID) | RESPIRATORY_TRACT | Status: DC

## 2016-08-17 NOTE — Progress Notes (Signed)
Patients family brought in fast food from outside.  Patient already given [place] sand.  Educated patient about diet.  Patient said very angry, No one will keep food from me.  I will eat what I want.  Patient also instructed could not eat while on bipap.  O2 applied at 4L's while patient ate.

## 2016-08-17 NOTE — Progress Notes (Signed)
[employer] [facility] [place] [facility]   PATIENT NAME: Curtis Russell    MR#:  [id number]  DATE OF BIRTH:  03/03/1956  SUBJECTIVE:   Continues to have wheezing and shortness of breath  REVIEW OF SYSTEMS:    Review of Systems  Constitutional: Negative for fever, chills weight loss HENT: Negative for ear pain, nosebleeds, congestion, facial swelling, rhinorrhea, neck pain, neck stiffness and ear discharge.   Respiratory: Positive for cough, shortness of breath and wheezing Cardiovascular: Negative for chest pain, palpitations and leg swelling.  Gastrointestinal: Negative for heartburn, abdominal pain, vomiting, diarrhea or consitpation Genitourinary: Negative for dysuria, urgency, frequency, hematuria Musculoskeletal: Negative for back pain or joint pain Neurological: Negative for dizziness, seizures, syncope, focal weakness,  numbness and headaches.  Hematological: Does not bruise/bleed easily.  Psychiatric/Behavioral: Negative for hallucinations, confusion, dysphoric mood    Tolerating Diet: yes      DRUG ALLERGIES:   Allergies  Allergen Reactions  . Bee Venom Anaphylaxis    VITALS:  Blood pressure (!) 150/92, pulse (!) 104, temperature 97.6 F (36.4 C), temperature source Axillary, resp. rate (!) 26, height 5\' 8"  (1.727 m), weight 115.4 kg (254 lb 6.6 oz), SpO2 92 %.  PHYSICAL EXAMINATION:  Constitutional: Appears well-developed and well-nourished. No distress.Obese HENT: Normocephalic. Marland Kitchen. Oropharynx is clear and moist.  Eyes: Conjunctivae and EOM are normal. PERRLA, no scleral icterus.  Neck: Normal ROM. Neck supple. No JVD. No tracheal deviation. CVS: RRR, S1/S2 +, no murmurs, no gallops, no carotid bruit.  Pulmonary: Slightly increased effort of breathing with bilateral wheezing Abdominal: Soft. BS +,  no distension, tenderness, rebound or guarding.  Musculoskeletal: Normal range of motion. No edema and no tenderness.  Neuro: Alert. CN 2-12 grossly  intact. No focal deficits. Skin: Skin is warm and dry. No rash noted. Chronic venous stasis changes lower extremities  Psychiatric: Normal mood and affect.      LABORATORY PANEL:   CBC  Recent Labs Lab 08/17/16 0328  WBC 12.7*  HGB 15.3  HCT 46.0  PLT 162   ------------------------------------------------------------------------------------------------------------------  Chemistries   Recent Labs Lab 08/16/16 1320 08/17/16 0328  NA 135 135  K 3.8 3.9  CL 101 102  CO2 22 23  GLUCOSE 115* 212*  BUN 22* 35*  CREATININE 1.13 1.22  CALCIUM 9.2 8.8*  MG 2.1  --    ------------------------------------------------------------------------------------------------------------------  Cardiac Enzymes  Recent Labs Lab 08/16/16 1320  TROPONINI <0.03   ------------------------------------------------------------------------------------------------------------------  RADIOLOGY:  Dg Chest 1 View  Result Date: 08/16/2016 CLINICAL DATA:  Worsening shortness of breath for 3 days EXAM: CHEST 1 VIEW COMPARISON:  03/25/2014 FINDINGS: The heart size and mediastinal contours are within normal limits. Both lungs are clear. The visualized skeletal structures are unremarkable. IMPRESSION: No active disease. Electronically Signed   By: Elige KoHetal  Patel   On: 08/16/2016 14:00     ASSESSMENT AND PLAN:   61 year old male with tobacco dependence and COPD who presents with acute on chronic respiratory failure.  1. Acute on chronic hypoxic respiratory failure due to acute exacerbation of COPD with acute bronchitis Patient is off of BiPAP for now Monitor in stepdown unit for another 24 hours Continue IV steroids, nebs and inhalers Continue azithromycin for acute bronchitis  2. Diabetes: Continue Lantus with sliding scale insulin and ADA diet  3. Essential hypertension on lisinopril/HCTZ  4. Hyperlipidemia: Continue statin  5. Tobacco dependence: Patient is encouraged to quit smoking.  Counseling was provided for 4 minutes.  D.w dr simonds Management  plans discussed with the patient and he is in agreement.  CODE STATUS: full  TOTAL TIME TAKING CARE OF THIS PATIENT: 30 minutes.     POSSIBLE D/C 2-3 days, DEPENDING ON CLINICAL CONDITION.   Curtis Russell M.D on 08/17/2016 at 11:08 AM  Between 7am to 6pm - Pager - [phone] After 6pm go to [email or web address] - password [email or web address]  [facility] [place] [facility]  Office  [phone]  CC: Primary care physician; Curtis Mina, MD  Note: This dictation was prepared with Dragon dictation along with smaller phrase technology. Any transcriptional errors that result from this process are unintentional.

## 2016-08-17 NOTE — Progress Notes (Signed)
Wife at bedside.  Patient now calm and cooperative.

## 2016-08-17 NOTE — Progress Notes (Signed)
Patient agitated.  Refuses to wear bipap.  Sats decreased to 85%.  Increase in work of breathing noted.  Patient states he wants to sign out of hospital.  Educated patient on importance of treatment.  Remains non-compliant.  Request wife be called.  Wife called and informed of situation.

## 2016-08-17 NOTE — Progress Notes (Signed)
Assisted to stand to void.  Tachypnic, resp upper 20's. Assisted back to bed.  Patient cooperative at this time.  Bipap removed, ice chips given.  Bipap replaced.

## 2016-08-17 NOTE — Progress Notes (Signed)
Pt refused Bipap and SVN.

## 2016-08-17 NOTE — Progress Notes (Signed)
Sats decreased to 87%/ Increase work of breathing noted.  Bipap applied. FI02 @ 40%

## 2016-08-17 NOTE — Plan of Care (Signed)
Problem: Education: Goal: Knowledge of the prescribed therapeutic regimen will improve Outcome: Not Progressing Patient non-compliant with treatment at times.  Becomes angry when educating about diet. Bipap.    Problem: Respiratory: Goal: Levels of oxygenation will improve Outcome: Not Progressing Patient takes bipap off at times,  work of breathing increases and SPO2 drops.  Needs much encouragement to comply with treatment   Goal: Ability to maintain adequate ventilation will improve Outcome: Not Progressing SPO2 decreased when bipap taken off.

## 2016-08-17 NOTE — Progress Notes (Signed)
Patient got up on knees, attempting to void.  Did not call nurse for assistance.  Voided large amount in bed.  [facility], patient very tachypnic.  Sats 89%.  Linen changed.

## 2016-08-17 NOTE — Progress Notes (Signed)
Family bringing pt super size [employer] meals. Pt states " I'll eat what I want". Educated. Dr.Simonds aware.

## 2016-08-18 LAB — GLUCOSE, CAPILLARY
GLUCOSE-CAPILLARY: 278 mg/dL — AB (ref 65–99)
GLUCOSE-CAPILLARY: 281 mg/dL — AB (ref 65–99)
Glucose-Capillary: 151 mg/dL — ABNORMAL HIGH (ref 65–99)
Glucose-Capillary: 287 mg/dL — ABNORMAL HIGH (ref 65–99)

## 2016-08-18 NOTE — Progress Notes (Signed)
Patient alert and oriented. No complaints of pain. Vital signs stable. Weaned to 2L nasal cannula, patient states he uses 2L at bedtime with CPAP at baseline. Sitting at bedside for meals x 2 today. Insulin administered to cover elevated blood glucose. Patient to transfer to 159, report called to Pulte HomesJennifer RN. Patient will transfer shortly.

## 2016-08-18 NOTE — Progress Notes (Signed)
[employer] - [place] at [facility]   PATIENT NAME: Curtis Russell    MR#:  [id number]  DATE OF BIRTH:  [birth year]  SUBJECTIVE:   Shortness of breath is improving. Wheezing is improving as well  REVIEW OF SYSTEMS:    Review of Systems  Constitutional: Negative for fever, chills weight loss HENT: Negative for ear pain, nosebleeds, congestion, facial swelling, rhinorrhea, neck pain, neck stiffness and ear discharge.   Respiratory: Positive for cough, shortness of breath and wheezing (However improved) Cardiovascular: Negative for chest pain, palpitations and leg swelling.  Gastrointestinal: Negative for heartburn, abdominal pain, vomiting, diarrhea or consitpation Genitourinary: Negative for dysuria, urgency, frequency, hematuria Musculoskeletal: Negative for back pain or joint pain Neurological: Negative for dizziness, seizures, syncope, focal weakness,  numbness and headaches.  Hematological: Does not bruise/bleed easily.  Psychiatric/Behavioral: Negative for hallucinations, confusion, dysphoric mood    Tolerating Diet: yes      DRUG ALLERGIES:   Allergies  Allergen Reactions  . Bee Venom Anaphylaxis    VITALS:  Blood pressure 109/68, pulse 81, temperature 97.7 F (36.5 C), temperature source Oral, resp. rate 16, height 5\' 8"  (1.727 m), weight 115.4 kg (254 lb 6.6 oz), SpO2 94 %.  PHYSICAL EXAMINATION:  Constitutional: Appears well-developed and well-nourished. No distress.Obese HENT: Normocephalic. Marland Kitchen Oropharynx is clear and moist.  Eyes: Conjunctivae and EOM are normal. PERRLA, no scleral icterus.  Neck: Normal ROM. Neck supple. No JVD. No tracheal deviation. CVS: RRR, S1/S2 +, no murmurs, no gallops, no carotid bruit.  Pulmonary: Good airflow with bilateral wheezing Abdominal: Soft. BS +,  no distension, tenderness, rebound or guarding.  Musculoskeletal: Normal range of motion. No edema and no tenderness.  Neuro: Alert. CN 2-12 grossly intact. No  focal deficits. Skin: Skin is warm and dry. No rash noted. Chronic venous stasis changes lower extremities  Psychiatric: Normal mood and affect.      LABORATORY PANEL:   CBC  Recent Labs Lab 08/17/16 0328  WBC 12.7*  HGB 15.3  HCT 46.0  PLT 162   ------------------------------------------------------------------------------------------------------------------  Chemistries   Recent Labs Lab 08/16/16 1320 08/17/16 0328  NA 135 135  K 3.8 3.9  CL 101 102  CO2 22 23  GLUCOSE 115* 212*  BUN 22* 35*  CREATININE 1.13 1.22  CALCIUM 9.2 8.8*  MG 2.1  --    ------------------------------------------------------------------------------------------------------------------  Cardiac Enzymes  Recent Labs Lab 08/16/16 1320  TROPONINI <0.03   ------------------------------------------------------------------------------------------------------------------  RADIOLOGY:  Dg Chest 1 View  Result Date: 08/16/2016 CLINICAL DATA:  Worsening shortness of breath for 3 days EXAM: CHEST 1 VIEW COMPARISON:  03/25/2014 FINDINGS: The heart size and mediastinal contours are within normal limits. Both lungs are clear. The visualized skeletal structures are unremarkable. IMPRESSION: No active disease. Electronically Signed   By: Elige Ko   On: 08/16/2016 14:00     ASSESSMENT AND PLAN:   61 year old male with tobacco dependence and COPD who presents with acute on chronic respiratory failure.  1. Acute on chronic hypoxic respiratory failure due to acute exacerbation of COPD with acute bronchitis Patient has been off BiPAP Wean oxygen as tolerated Continue IV steroids, nebs and inhalers Continue azithromycin for acute bronchitis  2. Diabetes: Continue Lantus with sliding scale insulin and ADA diet Blood sugars will improve as we start to wean steroids  3. Essential hypertension on lisinopril/HCTZ  4. Hyperlipidemia: Continue statin  5. Tobacco dependence: Patient is encouraged  to quit smoking. Counseling was provided for 4 minutes.  [place] and needs physical therapy consult   Management plans discussed with the patient and he is in agreement.  CODE STATUS: full  TOTAL TIME TAKING CARE OF THIS PATIENT: 30 minutes.     POSSIBLE D/C 2-3 days, DEPENDING ON CLINICAL CONDITION.   Alyas Creary M.D on 08/18/2016 at 8:40 AM  Between 7am to 6pm - Pager - [phone] After 6pm go to [email or web address] - password [email or web address]  [facility] [place] [facility]  Office  [phone]  CC: Primary care physician; Jerl MinaHedrick, James, MD  Note: This dictation was prepared with Dragon dictation along with smaller phrase technology. Any transcriptional errors that result from this process are unintentional.

## 2016-08-18 NOTE — Progress Notes (Signed)
Very angry about eLink remote monitoring ("spying"). Otherwise no new complaints. Did not wear BiPAP yesterday.  Vitals:   08/18/16 0800 08/18/16 1000 08/18/16 1035 08/18/16 1200  BP: 109/68 (!) 167/83  (!) 143/86  Pulse: 81 99 100 94  Resp: 16 (!) 28 (!) 40 (!) 21  Temp: 97.7 F (36.5 C)   97.5 F (36.4 C)  TempSrc: Oral   Oral  SpO2: 94% 91% 92% 94%  Weight:      Height:       NAD HEENT WNL No JVD Coarse BS with scattered wheezes Reg, no M NABS No edema  BMP Latest Ref Rng & Units 08/17/2016 08/16/2016 03/25/2014  Glucose 65 - 99 mg/dL 098(J212(H) 191(Y115(H) -  BUN 6 - 20 mg/dL 78(G35(H) 95(A22(H) -  Creatinine 0.61 - 1.24 mg/dL 2.131.22 0.861.13 5.780.94  Sodium 135 - 145 mmol/L 135 135 -  Potassium 3.5 - 5.1 mmol/L 3.9 3.8 -  Chloride 101 - 111 mmol/L 102 101 -  CO2 22 - 32 mmol/L 23 22 -  Calcium 8.9 - 10.3 mg/dL 4.6(N8.8(L) 9.2 -   CBC Latest Ref Rng & Units 08/17/2016 08/16/2016 03/25/2014  WBC 3.8 - 10.6 K/uL 12.7(H) 17.1(H) 12.1(H)  Hemoglobin 13.0 - 18.0 g/dL 62.915.3 52.816.6 41.313.0  Hematocrit 40.0 - 52.0 % 46.0 48.2 40.4  Platelets 150 - 440 K/uL 162 166 153   CXR: No new film   IMPRESSION: Acute on chronic respiratory failure AECOPD Smoker  PLAN/REC: Cont supplemental O2 Cont systemic steroids Cont nebulized steroids and bronchodilators Cont azithromycin Counseled re: smoking cessation Transfer out of ICU to med-surg  He is followed by Dr Meredeth IdeFleming as outpt. Please let him know of [name]'s hospitalization 05/07  PCCM will sign off. Please call if we can be of further assistance    Curtis Fischeravid Dyamond [place]osa, MD PCCM service Mobile [phone] Pager [phone] 08/18/2016 2:52 PM

## 2016-08-19 ENCOUNTER — Encounter: Payer: Self-pay | Admitting: *Deleted

## 2016-08-19 LAB — CULTURE, RESPIRATORY W GRAM STAIN

## 2016-08-19 LAB — CULTURE, RESPIRATORY
CULTURE: NORMAL
SPECIAL REQUESTS: NORMAL

## 2016-08-19 LAB — GLUCOSE, CAPILLARY
GLUCOSE-CAPILLARY: 259 mg/dL — AB (ref 65–99)
Glucose-Capillary: 235 mg/dL — ABNORMAL HIGH (ref 65–99)

## 2016-08-19 MED ORDER — AZITHROMYCIN 500 MG PO TABS: 500.0000 mg | ORAL_TABLET | Freq: Every day | ORAL | 0 refills | Status: AC

## 2016-08-19 MED ORDER — MAGNESIUM HYDROXIDE 400 MG/5ML PO SUSP
30.0000 mL | Freq: Every day | ORAL | Status: DC | PRN
  Administered 2016-08-19: 30 mL via ORAL
  Filled 2016-08-19: qty 30

## 2016-08-19 MED ORDER — DOCUSATE SODIUM 100 MG PO CAPS: 200.0000 mg | ORAL_CAPSULE | Freq: Two times a day (BID) | ORAL | Status: DC

## 2016-08-19 MED ORDER — NICOTINE 14 MG/24HR TD PT24
14.0000 mg | MEDICATED_PATCH | Freq: Every day | TRANSDERMAL | 0 refills | Status: DC
Start: 2016-08-19 — End: 2018-06-04

## 2016-08-19 MED ORDER — SENNA 8.6 MG PO TABS
1.0000 | ORAL_TABLET | Freq: Every day | ORAL | Status: DC
  Administered 2016-08-19: 8.6 mg via ORAL
  Filled 2016-08-19: qty 1

## 2016-08-19 MED ORDER — PREDNISONE 10 MG PO TABS: 10.0000 mg | ORAL_TABLET | Freq: Every day | ORAL | 0 refills | Status: DC

## 2016-08-19 NOTE — Progress Notes (Signed)
Patient was discharged home with wife. IV removed with cath intact. Reviewed med, scripts, and last dose of meds given. Follow-up appointment, MD's office to call to schedule. Allowed time for questions. LBM, this shift.

## 2016-08-19 NOTE — Discharge Summary (Signed)
[facility] - [place] [facility]   PATIENT NAME: Curtis Russell    MR#:  [id number]  DATE OF BIRTH:  05/24/1955  DATE OF ADMISSION:  08/16/2016 ADMITTING PHYSICIAN: Shaune Pollack, MD  DATE OF DISCHARGE: No discharge date for patient encounter.  PRIMARY CARE PHYSICIAN: Jerl Mina, MD    ADMISSION DIAGNOSIS:  COPD exacerbation (HCC) [J44.1] Acute on chronic respiratory failure with hypoxia (HCC) [J96.21]  DISCHARGE DIAGNOSIS:  Active Problems:   Acute on chronic respiratory failure with hypoxia (HCC)   COPD exacerbation (HCC)   Smoker   SECONDARY DIAGNOSIS:   Past Medical History:  Diagnosis Date  . COPD (chronic obstructive pulmonary disease) (HCC)   . Diabetes mellitus without complication (HCC) 2014  . Hyperlipidemia   . Nasal polyp   . Pleurisy 2015  . Pneumonia   . Sleep apnea     HOSPITAL COURSE:  61 year old male with tobacco dependence and COPD who presents with acute on chronic respiratory failure.  1. Acute on chronic hypoxic respiratory failure due to acute exacerbation of COPD with acute bronchitis Patient was initiated on BiPAP, IV steroids and azithromycin as well as duo nebs and inhalers. Patient has improved symptoms. Patient will be discharged with oxygen, steroid taper and azithromycin. He will have follow-up with his pulmonologist on Friday  2. Diabetes: Patient may resume outpatient regimen with ADA diet  3. Essential hypertension on lisinopril/HCTZ  4. Hyperlipidemia: Continue statin  5. Tobacco dependence: Patient is encouraged to quit smoking.   DISCHARGE CONDITIONS AND DIET:   Stable for discharge on diabetic diet  CONSULTS OBTAINED:  Treatment Team:  Cristy Folks, MD  DRUG ALLERGIES:   Allergies  Allergen Reactions  . Bee Venom Anaphylaxis    DISCHARGE MEDICATIONS:   Current Discharge Medication List    START taking these medications   Details  azithromycin (ZITHROMAX) 500 MG tablet Take 1 tablet  (500 mg total) by mouth daily. Qty: 2 tablet, Refills: 0    nicotine (NICODERM CQ - DOSED IN MG/24 HOURS) 14 mg/24hr patch Place 1 patch (14 mg total) onto the skin daily. Qty: 28 patch, Refills: 0    predniSONE (DELTASONE) 10 MG tablet Take 1 tablet (10 mg total) by mouth daily with breakfast. 50 mg PO (ORAL)  x 2 days 40 mg PO (ORAL)  x 2 days 30 mg PO  (ORAL)  x 2 days 20 mg PO  (ORAL) x 2 days 10 mg PO  (ORAL) x 2 days then stop Qty: 30 tablet, Refills: 0      CONTINUE these medications which have NOT CHANGED   Details  aspirin 81 MG tablet Take 81 mg by mouth daily.    canagliflozin (INVOKANA) 300 MG TABS tablet Take 300 mg by mouth daily before breakfast.    diphenhydrAMINE (SOMINEX) 25 MG tablet Take 25 mg by mouth at bedtime as needed for sleep.    Dulaglutide (TRULICITY) 0.75 MG/0.5ML SOPN Inject 1 vial into the skin once a week.    glipiZIDE (GLUCOTROL XL) 10 MG 24 hr tablet Take 10 mg by mouth daily with breakfast.    ipratropium-albuterol (DUONEB) 0.5-2.5 (3) MG/3ML SOLN Take 3 mLs by nebulization every 6 (six) hours as needed.    lisinopril-hydrochlorothiazide (PRINZIDE,ZESTORETIC) 20-12.5 MG tablet Take 2 tablets by mouth daily.    metFORMIN (GLUCOPHAGE) 500 MG tablet Take 1,000 mg by mouth 2 (two) times daily with a meal.    PRESCRIPTION MEDICATION Pt part of a study at [facility] and uses two  unknown inhalers as part of the blind study.    simvastatin (ZOCOR) 20 MG tablet TAKE ONE TABLET BY MOUTH NIGHTLY *PATIENT NEEDS TO CALL THE OFFICE FOR AN APPOINTMENT*    traZODone (DESYREL) 50 MG tablet Take 200 mg by mouth at bedtime.           Today   CHIEF COMPLAINT:  Doing well this morning. Has some constipation. No abdominal pain or nausea. Tolerating diet. Shortness of breath is improved. No wheezing   VITAL SIGNS:  Blood pressure 128/77, pulse 88, temperature 97.6 F (36.4 C), temperature source Oral, resp. rate 20, height 5\' 8"  (1.727 m), weight 115.4 kg  (254 lb 6.6 oz), SpO2 95 %.   REVIEW OF SYSTEMS:  Review of Systems  Constitutional: Negative.  Negative for chills, fever and malaise/fatigue.  HENT: Negative.  Negative for ear discharge, ear pain, hearing loss, nosebleeds and sore throat.   Eyes: Negative.  Negative for blurred vision and pain.  Respiratory: Positive for cough. Negative for hemoptysis, shortness of breath (better) and wheezing.   Cardiovascular: Negative.  Negative for chest pain, palpitations and leg swelling.  Gastrointestinal: Negative.  Negative for abdominal pain, blood in stool, diarrhea, nausea and vomiting.  Genitourinary: Negative.  Negative for dysuria.  Musculoskeletal: Negative.  Negative for back pain.  Skin: Negative.   Neurological: Negative for dizziness, tremors, speech change, focal weakness, seizures and headaches.  Endo/Heme/Allergies: Negative.  Does not bruise/bleed easily.  Psychiatric/Behavioral: Negative.  Negative for depression, hallucinations and suicidal ideas.     PHYSICAL EXAMINATION:  GENERAL:  61 y.o.-year-old patient lying in the bed with no acute distress. Obese NECK:  Supple, no jugular venous distention. No thyroid enlargement, no tenderness.  LUNGS: Normal breath sounds bilaterally, no wheezing, rales,rhonchi  No use of accessory muscles of respiration.  CARDIOVASCULAR: S1, S2 normal. No murmurs, rubs, or gallops.  ABDOMEN: Soft, non-tender, non-distended. Bowel sounds present. No organomegaly or mass.  EXTREMITIES: No pedal edema, cyanosis, or clubbing.  PSYCHIATRIC: The patient is alert and oriented x 3.  SKIN: No obvious rash, lesion, or ulcer. Chronic venous stasis changes  DATA REVIEW:   CBC  Recent Labs Lab 08/17/16 0328  WBC 12.7*  HGB 15.3  HCT 46.0  PLT 162    Chemistries   Recent Labs Lab 08/16/16 1320 08/17/16 0328  NA 135 135  K 3.8 3.9  CL 101 102  CO2 22 23  GLUCOSE 115* 212*  BUN 22* 35*  CREATININE 1.13 1.22  CALCIUM 9.2 8.8*  MG 2.1   --     Cardiac Enzymes  Recent Labs Lab 08/16/16 1320  TROPONINI <0.03    Microbiology Results  @MICRORSLT48 @  RADIOLOGY:  No results found.    Current Discharge Medication List    START taking these medications   Details  azithromycin (ZITHROMAX) 500 MG tablet Take 1 tablet (500 mg total) by mouth daily. Qty: 2 tablet, Refills: 0    nicotine (NICODERM CQ - DOSED IN MG/24 HOURS) 14 mg/24hr patch Place 1 patch (14 mg total) onto the skin daily. Qty: 28 patch, Refills: 0    predniSONE (DELTASONE) 10 MG tablet Take 1 tablet (10 mg total) by mouth daily with breakfast. 50 mg PO (ORAL)  x 2 days 40 mg PO (ORAL)  x 2 days 30 mg PO  (ORAL)  x 2 days 20 mg PO  (ORAL) x 2 days 10 mg PO  (ORAL) x 2 days then stop Qty: 30 tablet, Refills: 0  CONTINUE these medications which have NOT CHANGED   Details  aspirin 81 MG tablet Take 81 mg by mouth daily.    canagliflozin (INVOKANA) 300 MG TABS tablet Take 300 mg by mouth daily before breakfast.    diphenhydrAMINE (SOMINEX) 25 MG tablet Take 25 mg by mouth at bedtime as needed for sleep.    Dulaglutide (TRULICITY) 0.75 MG/0.5ML SOPN Inject 1 vial into the skin once a week.    glipiZIDE (GLUCOTROL XL) 10 MG 24 hr tablet Take 10 mg by mouth daily with breakfast.    ipratropium-albuterol (DUONEB) 0.5-2.5 (3) MG/3ML SOLN Take 3 mLs by nebulization every 6 (six) hours as needed.    lisinopril-hydrochlorothiazide (PRINZIDE,ZESTORETIC) 20-12.5 MG tablet Take 2 tablets by mouth daily.    metFORMIN (GLUCOPHAGE) 500 MG tablet Take 1,000 mg by mouth 2 (two) times daily with a meal.    PRESCRIPTION MEDICATION Pt part of a study at [facility] and uses two unknown inhalers as part of the blind study.    simvastatin (ZOCOR) 20 MG tablet TAKE ONE TABLET BY MOUTH NIGHTLY *PATIENT NEEDS TO CALL THE OFFICE FOR AN APPOINTMENT*    traZODone (DESYREL) 50 MG tablet Take 200 mg by mouth at bedtime.           Management plans discussed with the  patient and he is in agreement. Stable for discharge home  Patient should follow up with pcp  CODE STATUS:     Code Status Orders        Start     Ordered   08/16/16 1757  Full code  Continuous     08/16/16 1756    Code Status History    Date Active Date Inactive Code Status Order ID Comments User Context   This patient has a current code status but no historical code status.      TOTAL TIME TAKING CARE OF THIS PATIENT: 37 minutes.    Note: This dictation was prepared with Dragon dictation along with smaller phrase technology. Any transcriptional errors that result from this process are unintentional.  Sheva Mcdougle M.D on 08/19/2016 at 8:50 AM  Between 7am to 6pm - Pager - [phone] After 6pm go to [email or web address] - Social research officer, government  [facility]  Office  [phone]  CC: Primary care physician; Jerl Mina, MD

## 2016-08-21 LAB — CULTURE, BLOOD (ROUTINE X 2)
CULTURE: NO GROWTH
CULTURE: NO GROWTH
SPECIAL REQUESTS: ADEQUATE
Special Requests: ADEQUATE

## 2016-11-22 ENCOUNTER — Other Ambulatory Visit: Payer: Self-pay | Admitting: Specialist

## 2016-11-22 DIAGNOSIS — R0902 Hypoxemia: Secondary | ICD-10-CM

## 2016-11-22 DIAGNOSIS — R059 Cough, unspecified: Secondary | ICD-10-CM

## 2016-11-22 DIAGNOSIS — R0609 Other forms of dyspnea: Principal | ICD-10-CM

## 2016-11-22 DIAGNOSIS — R05 Cough: Secondary | ICD-10-CM

## 2016-11-22 DIAGNOSIS — R0600 Dyspnea, unspecified: Secondary | ICD-10-CM

## 2016-12-06 ENCOUNTER — Ambulatory Visit
Admission: RE | Admit: 2016-12-06 | Discharge: 2016-12-06 | Disposition: A | Discharge status: Home / Self Care | Payer: BLUE CROSS/BLUE SHIELD | Source: Ambulatory Visit | Attending: Specialist | Admitting: Specialist

## 2016-12-06 DIAGNOSIS — I2510 Atherosclerotic heart disease of native coronary artery without angina pectoris: Secondary | ICD-10-CM | POA: Insufficient documentation

## 2016-12-06 DIAGNOSIS — K760 Fatty (change of) liver, not elsewhere classified: Secondary | ICD-10-CM | POA: Diagnosis not present

## 2016-12-06 DIAGNOSIS — R0609 Other forms of dyspnea: Secondary | ICD-10-CM | POA: Diagnosis not present

## 2016-12-06 DIAGNOSIS — I700 Atherosclerosis of aorta: Secondary | ICD-10-CM | POA: Diagnosis not present

## 2016-12-06 DIAGNOSIS — R0902 Hypoxemia: Secondary | ICD-10-CM | POA: Diagnosis present

## 2016-12-06 DIAGNOSIS — R05 Cough: Secondary | ICD-10-CM | POA: Diagnosis present

## 2016-12-06 DIAGNOSIS — R222 Localized swelling, mass and lump, trunk: Secondary | ICD-10-CM | POA: Diagnosis not present

## 2016-12-06 DIAGNOSIS — R059 Cough, unspecified: Secondary | ICD-10-CM

## 2016-12-06 DIAGNOSIS — R918 Other nonspecific abnormal finding of lung field: Secondary | ICD-10-CM | POA: Diagnosis not present

## 2016-12-11 ENCOUNTER — Encounter
Admission: RE | Disposition: A | Discharge status: Home / Self Care | Payer: Self-pay | Source: Ambulatory Visit | Attending: Internal Medicine

## 2016-12-11 ENCOUNTER — Encounter: Payer: Self-pay | Admitting: *Deleted

## 2016-12-11 ENCOUNTER — Ambulatory Visit
Admission: RE | Admit: 2016-12-11 | Discharge: 2016-12-11 | Disposition: A | Discharge status: Home / Self Care | Payer: BLUE CROSS/BLUE SHIELD | Source: Ambulatory Visit | Attending: Internal Medicine | Admitting: Internal Medicine

## 2016-12-11 DIAGNOSIS — I2582 Chronic total occlusion of coronary artery: Secondary | ICD-10-CM | POA: Diagnosis not present

## 2016-12-11 DIAGNOSIS — I2510 Atherosclerotic heart disease of native coronary artery without angina pectoris: Secondary | ICD-10-CM | POA: Insufficient documentation

## 2016-12-11 DIAGNOSIS — G4733 Obstructive sleep apnea (adult) (pediatric): Secondary | ICD-10-CM | POA: Diagnosis not present

## 2016-12-11 DIAGNOSIS — Z794 Long term (current) use of insulin: Secondary | ICD-10-CM | POA: Diagnosis not present

## 2016-12-11 DIAGNOSIS — I110 Hypertensive heart disease with heart failure: Secondary | ICD-10-CM | POA: Insufficient documentation

## 2016-12-11 DIAGNOSIS — E782 Mixed hyperlipidemia: Secondary | ICD-10-CM | POA: Diagnosis not present

## 2016-12-11 DIAGNOSIS — Z8249 Family history of ischemic heart disease and other diseases of the circulatory system: Secondary | ICD-10-CM | POA: Insufficient documentation

## 2016-12-11 DIAGNOSIS — Z7982 Long term (current) use of aspirin: Secondary | ICD-10-CM | POA: Diagnosis not present

## 2016-12-11 DIAGNOSIS — I5022 Chronic systolic (congestive) heart failure: Secondary | ICD-10-CM | POA: Insufficient documentation

## 2016-12-11 DIAGNOSIS — R0902 Hypoxemia: Secondary | ICD-10-CM | POA: Insufficient documentation

## 2016-12-11 DIAGNOSIS — F17210 Nicotine dependence, cigarettes, uncomplicated: Secondary | ICD-10-CM | POA: Insufficient documentation

## 2016-12-11 DIAGNOSIS — E119 Type 2 diabetes mellitus without complications: Secondary | ICD-10-CM | POA: Diagnosis not present

## 2016-12-11 DIAGNOSIS — J449 Chronic obstructive pulmonary disease, unspecified: Secondary | ICD-10-CM | POA: Diagnosis not present

## 2016-12-11 HISTORY — PX: RIGHT/LEFT HEART CATH AND CORONARY ANGIOGRAPHY: CATH118266

## 2016-12-11 LAB — CARDIAC CATHETERIZATION: Cath EF Quantitative: 60 %

## 2016-12-11 SURGERY — RIGHT/LEFT HEART CATH AND CORONARY ANGIOGRAPHY
Anesthesia: Moderate Sedation | Laterality: Bilateral

## 2016-12-11 SURGERY — RIGHT/LEFT HEART CATH AND CORONARY ANGIOGRAPHY
Anesthesia: Moderate Sedation

## 2016-12-11 MED ORDER — SODIUM CHLORIDE 0.9% FLUSH: 3.0000 mL | INTRAVENOUS | Status: DC | PRN

## 2016-12-11 MED ORDER — FENTANYL CITRATE (PF) 100 MCG/2ML IJ SOLN
INTRAMUSCULAR | Status: DC | PRN
  Administered 2016-12-11: 50 ug via INTRAVENOUS
  Administered 2016-12-11: 25 ug via INTRAVENOUS

## 2016-12-11 MED ORDER — FENTANYL CITRATE (PF) 100 MCG/2ML IJ SOLN
INTRAMUSCULAR | Status: AC
  Filled 2016-12-11: qty 2

## 2016-12-11 MED ORDER — MIDAZOLAM HCL 2 MG/2ML IJ SOLN
INTRAMUSCULAR | Status: AC
  Filled 2016-12-11: qty 2

## 2016-12-11 MED ORDER — SODIUM CHLORIDE 0.9 % IV SOLN: 250.0000 mL | INTRAVENOUS | Status: DC | PRN

## 2016-12-11 MED ORDER — ONDANSETRON HCL 4 MG/2ML IJ SOLN: 4.0000 mg | Freq: Four times a day (QID) | INTRAMUSCULAR | Status: DC | PRN

## 2016-12-11 MED ORDER — SODIUM CHLORIDE 0.9 % WEIGHT BASED INFUSION
3.0000 mL/kg/h | INTRAVENOUS | Status: AC
  Administered 2016-12-11: 3 mL/kg/h via INTRAVENOUS

## 2016-12-11 MED ORDER — SODIUM CHLORIDE 0.9 % WEIGHT BASED INFUSION: 1.0000 mL/kg/h | INTRAVENOUS | Status: DC

## 2016-12-11 MED ORDER — SODIUM CHLORIDE 0.9% FLUSH: 3.0000 mL | Freq: Two times a day (BID) | INTRAVENOUS | Status: DC

## 2016-12-11 MED ORDER — IPRATROPIUM-ALBUTEROL 0.5-2.5 (3) MG/3ML IN SOLN: 3.0000 mL | Freq: Once | RESPIRATORY_TRACT | Status: DC

## 2016-12-11 MED ORDER — IPRATROPIUM-ALBUTEROL 0.5-2.5 (3) MG/3ML IN SOLN
RESPIRATORY_TRACT | Status: AC
  Filled 2016-12-11: qty 3

## 2016-12-11 MED ORDER — LIDOCAINE HCL (PF) 1 % IJ SOLN
INTRAMUSCULAR | Status: AC
  Filled 2016-12-11: qty 30

## 2016-12-11 MED ORDER — ASPIRIN 81 MG PO CHEW: 81.0000 mg | CHEWABLE_TABLET | ORAL | Status: DC

## 2016-12-11 MED ORDER — HEPARIN (PORCINE) IN NACL 2-0.9 UNIT/ML-% IJ SOLN
INTRAMUSCULAR | Status: AC
  Filled 2016-12-11: qty 500

## 2016-12-11 MED ORDER — ACETAMINOPHEN 325 MG PO TABS: 650.0000 mg | ORAL_TABLET | ORAL | Status: DC | PRN

## 2016-12-11 MED ORDER — MIDAZOLAM HCL 2 MG/2ML IJ SOLN
INTRAMUSCULAR | Status: DC | PRN
  Administered 2016-12-11 (×2): 1 mg via INTRAVENOUS

## 2016-12-11 MED ORDER — IOPAMIDOL (ISOVUE-300) INJECTION 61%
INTRAVENOUS | Status: DC | PRN
  Administered 2016-12-11: 130 mL via INTRA_ARTERIAL

## 2016-12-11 SURGICAL SUPPLY — 13 items
CATH 5FR JL4 DIAGNOSTIC (CATHETERS) ×3 IMPLANT
CATH INFINITI 5FR ANG PIGTAIL (CATHETERS) ×3 IMPLANT
CATH INFINITI JR4 5F (CATHETERS) ×3 IMPLANT
CATH SWANZ 7F THERMO (CATHETERS) ×3 IMPLANT
DEVICE CLOSURE MYNXGRIP 5F (Vascular Products) ×3 IMPLANT
GUIDEWIRE 3MM J TIP .035 145 (WIRE) ×3 IMPLANT
GUIDEWIRE EMER 3M J .025X150CM (WIRE) ×3 IMPLANT
KIT MANI 3VAL PERCEP (MISCELLANEOUS) ×3 IMPLANT
KIT RIGHT HEART (MISCELLANEOUS) ×3 IMPLANT
NEEDLE PERC 18GX7CM (NEEDLE) ×3 IMPLANT
PACK CARDIAC CATH (CUSTOM PROCEDURE TRAY) ×3 IMPLANT
SHEATH AVANTI 5FR X 11CM (SHEATH) ×3 IMPLANT
SHEATH PINNACLE 7F 10CM (SHEATH) ×3 IMPLANT

## 2016-12-12 ENCOUNTER — Encounter: Payer: Self-pay | Admitting: Internal Medicine

## 2018-06-04 ENCOUNTER — Other Ambulatory Visit: Payer: Self-pay

## 2018-06-04 ENCOUNTER — Encounter: Payer: Self-pay | Admitting: Emergency Medicine

## 2018-06-04 ENCOUNTER — Emergency Department: Payer: BLUE CROSS/BLUE SHIELD

## 2018-06-04 ENCOUNTER — Inpatient Hospital Stay
Admission: EM | Admit: 2018-06-04 | Discharge: 2018-06-07 | DRG: 190 | Disposition: A | Discharge status: Home / Self Care | Payer: BLUE CROSS/BLUE SHIELD | Attending: Internal Medicine | Admitting: Internal Medicine

## 2018-06-04 DIAGNOSIS — G4733 Obstructive sleep apnea (adult) (pediatric): Secondary | ICD-10-CM | POA: Diagnosis present

## 2018-06-04 DIAGNOSIS — Z7984 Long term (current) use of oral hypoglycemic drugs: Secondary | ICD-10-CM | POA: Diagnosis not present

## 2018-06-04 DIAGNOSIS — E119 Type 2 diabetes mellitus without complications: Secondary | ICD-10-CM | POA: Diagnosis present

## 2018-06-04 DIAGNOSIS — E785 Hyperlipidemia, unspecified: Secondary | ICD-10-CM | POA: Diagnosis present

## 2018-06-04 DIAGNOSIS — Z9981 Dependence on supplemental oxygen: Secondary | ICD-10-CM | POA: Diagnosis not present

## 2018-06-04 DIAGNOSIS — Z7951 Long term (current) use of inhaled steroids: Secondary | ICD-10-CM

## 2018-06-04 DIAGNOSIS — F17210 Nicotine dependence, cigarettes, uncomplicated: Secondary | ICD-10-CM | POA: Diagnosis present

## 2018-06-04 DIAGNOSIS — J440 Chronic obstructive pulmonary disease with acute lower respiratory infection: Secondary | ICD-10-CM | POA: Diagnosis present

## 2018-06-04 DIAGNOSIS — Z833 Family history of diabetes mellitus: Secondary | ICD-10-CM | POA: Diagnosis not present

## 2018-06-04 DIAGNOSIS — I248 Other forms of acute ischemic heart disease: Secondary | ICD-10-CM | POA: Diagnosis present

## 2018-06-04 DIAGNOSIS — Z66 Do not resuscitate: Secondary | ICD-10-CM | POA: Diagnosis present

## 2018-06-04 DIAGNOSIS — J441 Chronic obstructive pulmonary disease with (acute) exacerbation: Principal | ICD-10-CM | POA: Diagnosis present

## 2018-06-04 DIAGNOSIS — J189 Pneumonia, unspecified organism: Secondary | ICD-10-CM | POA: Diagnosis present

## 2018-06-04 DIAGNOSIS — Z79899 Other long term (current) drug therapy: Secondary | ICD-10-CM | POA: Diagnosis not present

## 2018-06-04 DIAGNOSIS — E876 Hypokalemia: Secondary | ICD-10-CM | POA: Diagnosis present

## 2018-06-04 DIAGNOSIS — N179 Acute kidney failure, unspecified: Secondary | ICD-10-CM | POA: Diagnosis present

## 2018-06-04 DIAGNOSIS — Z7982 Long term (current) use of aspirin: Secondary | ICD-10-CM | POA: Diagnosis not present

## 2018-06-04 DIAGNOSIS — R0600 Dyspnea, unspecified: Secondary | ICD-10-CM

## 2018-06-04 LAB — INFLUENZA PANEL BY PCR (TYPE A & B)
INFLAPCR: NEGATIVE
Influenza B By PCR: NEGATIVE

## 2018-06-04 LAB — CBC
HCT: 43.5 % (ref 39.0–52.0)
Hemoglobin: 14.9 g/dL (ref 13.0–17.0)
MCH: 29.3 pg (ref 26.0–34.0)
MCHC: 34.3 g/dL (ref 30.0–36.0)
MCV: 85.5 fL (ref 80.0–100.0)
Platelets: 189 10*3/uL (ref 150–400)
RBC: 5.09 MIL/uL (ref 4.22–5.81)
RDW: 13.8 % (ref 11.5–15.5)
WBC: 14.8 10*3/uL — AB (ref 4.0–10.5)
nRBC: 0 % (ref 0.0–0.2)

## 2018-06-04 LAB — GLUCOSE, CAPILLARY
Glucose-Capillary: 122 mg/dL — ABNORMAL HIGH (ref 70–99)
Glucose-Capillary: 199 mg/dL — ABNORMAL HIGH (ref 70–99)
Glucose-Capillary: 256 mg/dL — ABNORMAL HIGH (ref 70–99)

## 2018-06-04 LAB — BASIC METABOLIC PANEL
Anion gap: 13 (ref 5–15)
BUN: 30 mg/dL — AB (ref 8–23)
CO2: 17 mmol/L — ABNORMAL LOW (ref 22–32)
CREATININE: 1.8 mg/dL — AB (ref 0.61–1.24)
Calcium: 8.8 mg/dL — ABNORMAL LOW (ref 8.9–10.3)
Chloride: 107 mmol/L (ref 98–111)
GFR calc Af Amer: 46 mL/min — ABNORMAL LOW (ref >60)
GFR, EST NON AFRICAN AMERICAN: 39 mL/min — AB (ref >60)
GLUCOSE: 119 mg/dL — AB (ref 70–99)
Potassium: 3.5 mmol/L (ref 3.5–5.1)
SODIUM: 137 mmol/L (ref 135–145)

## 2018-06-04 LAB — TROPONIN I
Troponin I: 0.11 ng/mL (ref <0.03)
Troponin I: <0.03 ng/mL (ref <0.03)
Troponin I: <0.03 ng/mL (ref <0.03)
Troponin I: <0.03 ng/mL (ref <0.03)

## 2018-06-04 LAB — BRAIN NATRIURETIC PEPTIDE: B NATRIURETIC PEPTIDE 5: 74 pg/mL (ref 0.0–100.0)

## 2018-06-04 LAB — LACTIC ACID, PLASMA: LACTIC ACID, VENOUS: 1.8 mmol/L (ref 0.5–1.9)

## 2018-06-04 MED ORDER — SODIUM CHLORIDE 0.9% FLUSH
3.0000 mL | Freq: Once | INTRAVENOUS | Status: AC
  Administered 2018-06-04: 3 mL via INTRAVENOUS

## 2018-06-04 MED ORDER — DULAGLUTIDE 1.5 MG/0.5ML ~~LOC~~ SOAJ: 1.5000 mg | SUBCUTANEOUS | Status: DC

## 2018-06-04 MED ORDER — ATORVASTATIN CALCIUM 20 MG PO TABS: 40.0000 mg | ORAL_TABLET | Freq: Every day | ORAL | Status: DC

## 2018-06-04 MED ORDER — AZITHROMYCIN 500 MG PO TABS
500.0000 mg | ORAL_TABLET | Freq: Every day | ORAL | Status: DC
  Administered 2018-06-05 – 2018-06-07 (×3): 500 mg via ORAL
  Filled 2018-06-04 (×3): qty 1

## 2018-06-04 MED ORDER — INSULIN ASPART 100 UNIT/ML ~~LOC~~ SOLN
0.0000 [IU] | Freq: Three times a day (TID) | SUBCUTANEOUS | Status: DC
  Administered 2018-06-04: 3 [IU] via SUBCUTANEOUS
  Administered 2018-06-04 – 2018-06-05 (×2): 4 [IU] via SUBCUTANEOUS
  Administered 2018-06-05: 11 [IU] via SUBCUTANEOUS
  Administered 2018-06-05 – 2018-06-06 (×2): 7 [IU] via SUBCUTANEOUS
  Administered 2018-06-06: 11 [IU] via SUBCUTANEOUS
  Administered 2018-06-06 – 2018-06-07 (×2): 4 [IU] via SUBCUTANEOUS
  Administered 2018-06-07: 15 [IU] via SUBCUTANEOUS
  Filled 2018-06-04 (×10): qty 1

## 2018-06-04 MED ORDER — INSULIN ASPART 100 UNIT/ML ~~LOC~~ SOLN
0.0000 [IU] | Freq: Every day | SUBCUTANEOUS | Status: DC
  Administered 2018-06-04: 3 [IU] via SUBCUTANEOUS
  Filled 2018-06-04: qty 1

## 2018-06-04 MED ORDER — IPRATROPIUM-ALBUTEROL 0.5-2.5 (3) MG/3ML IN SOLN
3.0000 mL | RESPIRATORY_TRACT | Status: DC
  Administered 2018-06-04 – 2018-06-07 (×18): 3 mL via RESPIRATORY_TRACT
  Filled 2018-06-04: qty 6
  Filled 2018-06-04 (×17): qty 3

## 2018-06-04 MED ORDER — MOMETASONE FURO-FORMOTEROL FUM 200-5 MCG/ACT IN AERO
2.0000 | INHALATION_SPRAY | Freq: Two times a day (BID) | RESPIRATORY_TRACT | Status: DC
  Administered 2018-06-04 – 2018-06-07 (×7): 2 via RESPIRATORY_TRACT
  Filled 2018-06-04 (×2): qty 8.8

## 2018-06-04 MED ORDER — ASPIRIN EC 81 MG PO TBEC
81.0000 mg | DELAYED_RELEASE_TABLET | Freq: Every day | ORAL | Status: DC
  Administered 2018-06-04 – 2018-06-07 (×3): 81 mg via ORAL
  Filled 2018-06-04 (×4): qty 1

## 2018-06-04 MED ORDER — TRAZODONE HCL 100 MG PO TABS
200.0000 mg | ORAL_TABLET | Freq: Every day | ORAL | Status: DC
  Administered 2018-06-04 – 2018-06-06 (×3): 200 mg via ORAL
  Filled 2018-06-04 (×3): qty 2

## 2018-06-04 MED ORDER — ISOSORBIDE MONONITRATE ER 30 MG PO TB24
30.0000 mg | ORAL_TABLET | Freq: Every day | ORAL | Status: DC
  Administered 2018-06-05 – 2018-06-07 (×3): 30 mg via ORAL
  Filled 2018-06-04 (×3): qty 1

## 2018-06-04 MED ORDER — DILTIAZEM HCL ER COATED BEADS 120 MG PO CP24
120.0000 mg | ORAL_CAPSULE | Freq: Every day | ORAL | Status: DC
  Administered 2018-06-05 – 2018-06-07 (×3): 120 mg via ORAL
  Filled 2018-06-04 (×3): qty 1

## 2018-06-04 MED ORDER — ONDANSETRON HCL 4 MG/2ML IJ SOLN: 4.0000 mg | Freq: Four times a day (QID) | INTRAMUSCULAR | Status: DC | PRN

## 2018-06-04 MED ORDER — SODIUM CHLORIDE 0.9 % IV SOLN
500.0000 mg | Freq: Once | INTRAVENOUS | Status: AC
  Administered 2018-06-04: 500 mg via INTRAVENOUS
  Filled 2018-06-04: qty 500

## 2018-06-04 MED ORDER — DIPHENHYDRAMINE HCL 25 MG PO CAPS
25.0000 mg | ORAL_CAPSULE | Freq: Two times a day (BID) | ORAL | Status: DC
  Administered 2018-06-04 – 2018-06-07 (×6): 25 mg via ORAL
  Filled 2018-06-04 (×7): qty 1

## 2018-06-04 MED ORDER — ENOXAPARIN SODIUM 40 MG/0.4ML ~~LOC~~ SOLN
40.0000 mg | SUBCUTANEOUS | Status: DC
  Administered 2018-06-04 – 2018-06-06 (×3): 40 mg via SUBCUTANEOUS
  Filled 2018-06-04 (×4): qty 0.4

## 2018-06-04 MED ORDER — ACETAMINOPHEN 650 MG RE SUPP: 650.0000 mg | Freq: Four times a day (QID) | RECTAL | Status: DC | PRN

## 2018-06-04 MED ORDER — HYDROCODONE-ACETAMINOPHEN 5-325 MG PO TABS: 1.0000 | ORAL_TABLET | ORAL | Status: DC | PRN

## 2018-06-04 MED ORDER — POLYETHYLENE GLYCOL 3350 17 G PO PACK: 17.0000 g | PACK | Freq: Every day | ORAL | Status: DC | PRN

## 2018-06-04 MED ORDER — ONDANSETRON HCL 4 MG PO TABS: 4.0000 mg | ORAL_TABLET | Freq: Four times a day (QID) | ORAL | Status: DC | PRN

## 2018-06-04 MED ORDER — ALBUTEROL SULFATE (2.5 MG/3ML) 0.083% IN NEBU
INHALATION_SOLUTION | RESPIRATORY_TRACT | Status: AC
  Filled 2018-06-04: qty 3

## 2018-06-04 MED ORDER — LISINOPRIL 20 MG PO TABS
40.0000 mg | ORAL_TABLET | Freq: Every day | ORAL | Status: DC
  Administered 2018-06-04 – 2018-06-07 (×4): 40 mg via ORAL
  Filled 2018-06-04: qty 4
  Filled 2018-06-04 (×3): qty 2

## 2018-06-04 MED ORDER — ACETAMINOPHEN 325 MG PO TABS: 650.0000 mg | ORAL_TABLET | Freq: Four times a day (QID) | ORAL | Status: DC | PRN

## 2018-06-04 MED ORDER — CANAGLIFLOZIN 100 MG PO TABS: 300.0000 mg | ORAL_TABLET | Freq: Every day | ORAL | Status: DC

## 2018-06-04 MED ORDER — SODIUM CHLORIDE 0.9 % IV SOLN
1.0000 g | Freq: Once | INTRAVENOUS | Status: AC
  Administered 2018-06-04: 1 g via INTRAVENOUS
  Filled 2018-06-04: qty 10

## 2018-06-04 MED ORDER — DOXYCYCLINE HYCLATE 100 MG PO TABS: 100.0000 mg | ORAL_TABLET | Freq: Two times a day (BID) | ORAL | Status: DC

## 2018-06-04 MED ORDER — SIMVASTATIN 10 MG PO TABS: 20.0000 mg | ORAL_TABLET | Freq: Every day | ORAL | Status: DC

## 2018-06-04 MED ORDER — ATORVASTATIN CALCIUM 10 MG PO TABS
10.0000 mg | ORAL_TABLET | Freq: Every day | ORAL | Status: DC
  Administered 2018-06-04 – 2018-06-06 (×3): 10 mg via ORAL
  Filled 2018-06-04 (×3): qty 1

## 2018-06-04 MED ORDER — IPRATROPIUM BROMIDE 0.02 % IN SOLN
0.5000 mg | Freq: Once | RESPIRATORY_TRACT | Status: AC
  Administered 2018-06-04: 0.5 mg via RESPIRATORY_TRACT
  Filled 2018-06-04: qty 2.5

## 2018-06-04 MED ORDER — NICOTINE 14 MG/24HR TD PT24
14.0000 mg | MEDICATED_PATCH | Freq: Every day | TRANSDERMAL | Status: DC
  Administered 2018-06-04 – 2018-06-07 (×4): 14 mg via TRANSDERMAL
  Filled 2018-06-04 (×4): qty 1

## 2018-06-04 MED ORDER — ALBUTEROL SULFATE (2.5 MG/3ML) 0.083% IN NEBU
5.0000 mg | INHALATION_SOLUTION | Freq: Once | RESPIRATORY_TRACT | Status: AC
  Administered 2018-06-04: 5 mg via RESPIRATORY_TRACT
  Filled 2018-06-04: qty 6

## 2018-06-04 MED ORDER — GLIPIZIDE ER 10 MG PO TB24
10.0000 mg | ORAL_TABLET | Freq: Every evening | ORAL | Status: DC
  Administered 2018-06-04 – 2018-06-06 (×3): 10 mg via ORAL
  Filled 2018-06-04 (×5): qty 1

## 2018-06-04 MED ORDER — SODIUM CHLORIDE 0.9 % IV BOLUS
1000.0000 mL | Freq: Once | INTRAVENOUS | Status: AC
  Administered 2018-06-04: 1000 mL via INTRAVENOUS

## 2018-06-04 MED ORDER — HYDROCHLOROTHIAZIDE 25 MG PO TABS
25.0000 mg | ORAL_TABLET | Freq: Every day | ORAL | Status: DC
  Administered 2018-06-04 – 2018-06-07 (×4): 25 mg via ORAL
  Filled 2018-06-04 (×4): qty 1

## 2018-06-04 MED ORDER — METHYLPREDNISOLONE SODIUM SUCC 40 MG IJ SOLR
40.0000 mg | Freq: Two times a day (BID) | INTRAMUSCULAR | Status: DC
  Administered 2018-06-04 – 2018-06-07 (×6): 40 mg via INTRAVENOUS
  Filled 2018-06-04 (×6): qty 1

## 2018-06-04 MED ORDER — METHYLPREDNISOLONE SODIUM SUCC 125 MG IJ SOLR
125.0000 mg | Freq: Once | INTRAMUSCULAR | Status: AC
  Administered 2018-06-04: 125 mg via INTRAVENOUS
  Filled 2018-06-04: qty 2

## 2018-06-04 MED ORDER — THEOPHYLLINE ER 300 MG PO TB12
300.0000 mg | ORAL_TABLET | Freq: Two times a day (BID) | ORAL | Status: DC
  Administered 2018-06-04 – 2018-06-07 (×6): 300 mg via ORAL
  Filled 2018-06-04 (×8): qty 1

## 2018-06-04 MED ORDER — SODIUM CHLORIDE 0.9 % IV SOLN
1.0000 g | INTRAVENOUS | Status: DC
  Administered 2018-06-05 – 2018-06-07 (×3): 1 g via INTRAVENOUS
  Filled 2018-06-04 (×3): qty 1

## 2018-06-04 MED ORDER — LISINOPRIL-HYDROCHLOROTHIAZIDE 20-12.5 MG PO TABS: 2.0000 | ORAL_TABLET | Freq: Every day | ORAL | Status: DC

## 2018-06-04 NOTE — ED Notes (Signed)
Sent BCx2 and lactic acid to lab.

## 2018-06-04 NOTE — ED Notes (Signed)
Pt given food tray and drink.  

## 2018-06-04 NOTE — Progress Notes (Signed)
PHARMACIST - PHYSICIAN ORDER COMMUNICATION  This patient's order for:  Dulaglutide (Trulicity)  has been noted.  Pharmacy and Therapeutics Committee does not permit the use of products of this type within [facility].   ACTION TAKEN: The pharmacy department is unable to verify this order at this time.  Please reevaluate patient's clinical condition at discharge and address if the product should be resumed at that time.

## 2018-06-04 NOTE — ED Provider Notes (Signed)
[facility] Emergency Department Provider Note  ____________________________________________   I have reviewed the triage vital signs and the nursing notes. Where available I have reviewed prior notes and, if possible and indicated, outside hospital notes.    HISTORY  Chief Complaint Shortness of Breath    HPI Curtis Russell is a 63 y.o. male  of significant COPD on home oxygen at night, history of recurrent necessity for steroids, followed by pulmonologist, history of hypoxic respiratory failure in the past, presents today complaining of multiple things.  He states over the last couple days he has had diarrhea which was watery, starting yesterday or the day before, as well as some nausea and a couple episodes of posttussis emesis.  He had a fever 102 yesterday, no fever today, he thought his sats were low this morning and his heart rate is been up.  He has not had any chest pain, he does continue to smoke.  He states that he is breathing poorly but has been breathing like this for "6 years".  He states that he is coughing and is occasionally productive his cough is more than normal.  Usually he gets steroids and a Z-Pak and he feels better but he felt bad and he came in today.  No leg swelling.  No other alleviating or aggravating factors no prior treatment otherwise, no other complaints.  No melena no bright red blood per rectum.    Past Medical History:  Diagnosis Date  . COPD (chronic obstructive pulmonary disease) (HCC)   . Diabetes mellitus without complication (HCC) 2014  . Hyperlipidemia   . Nasal polyp   . Pleurisy 2015  . Pneumonia   . Sleep apnea     Patient Active Problem List   Diagnosis Date Noted  . COPD exacerbation (HCC)   . Smoker   . Acute on chronic respiratory failure with hypoxia (HCC) 08/16/2016  . Abdominal wall abscess 01/11/2015    Past Surgical History:  Procedure Laterality Date  . HERNIA REPAIR  2001  . HERNIA REPAIR     bil inguinal  . NASAL SINUS SURGERY    . RIGHT/LEFT HEART CATH AND CORONARY ANGIOGRAPHY Bilateral 12/11/2016   Procedure: RIGHT/LEFT HEART CATH AND CORONARY ANGIOGRAPHY;  Surgeon: Alwyn Pea, MD;  Location: [facility] INVASIVE CV LAB;  Service: Cardiovascular;  Laterality: Bilateral;    Prior to Admission medications   Medication Sig Start Date End Date Taking? Authorizing Provider  aspirin EC 81 MG tablet Take 81 mg by mouth daily.    [provider]  budesonide-formoterol (SYMBICORT) 160-4.5 MCG/ACT inhaler Inhale 2 puffs into the lungs 2 (two) times daily.    [provider]  canagliflozin (INVOKANA) 300 MG TABS tablet Take 300 mg by mouth daily before breakfast.    [provider]  diphenhydrAMINE (SOMINEX) 25 MG tablet Take 25 mg by mouth 2 (two) times daily.     [provider]  glipiZIDE (GLUCOTROL XL) 10 MG 24 hr tablet Take 10 mg by mouth every evening.     [provider]  Investigational - Study Medication Inhale 2 puffs into the lungs 2 (two) times daily. Study name:Qvar? Additional study details:[facility]    [provider]  ipratropium-albuterol (DUONEB) 0.5-2.5 (3) MG/3ML SOLN Take 3 mLs by nebulization 4 (four) times daily.     [provider]  lisinopril-hydrochlorothiazide (PRINZIDE,ZESTORETIC) 20-12.5 MG tablet Take 2 tablets by mouth daily.    [provider]  metFORMIN (GLUCOPHAGE) 500  MG tablet Take 1,000 mg by mouth 2 (two) times daily with a meal.    [provider]  nicotine (NICODERM CQ - DOSED IN MG/24 HOURS) 14 mg/24hr patch Place 1 patch (14 mg total) onto the skin daily. Patient not taking: Reported on 12/09/2016 08/19/16   Adrian SaranMody, Sital, MD  predniSONE (DELTASONE) 10 MG tablet Take 1 tablet (10 mg total) by mouth daily with breakfast. 50 mg PO (ORAL)  x 2 days 40 mg PO (ORAL)  x 2 days 30 mg PO  (ORAL)  x 2 days 20 mg PO  (ORAL) x 2 days 10 mg PO  (ORAL) x 2 days then  stop Patient not taking: Reported on 12/09/2016 08/19/16   Adrian SaranMody, Sital, MD  simvastatin (ZOCOR) 20 MG tablet TAKE ONE TABLET BY MOUTH AT BEDTIME 01/10/15   [provider]  traZODone (DESYREL) 50 MG tablet Take 200 mg by mouth at bedtime.     [provider]  TRULICITY 1.5 MG/0.5ML SOPN Inject 1.5 mg into the skin every Tuesday. 11/06/16   [provider]    Allergies Bee venom  Family History  Problem Relation Age of Onset  . COPD Mother   . Diabetes Father     Social History Social History   Tobacco Use  . Smoking status: Current Every Day Smoker    Packs/day: 1.50    Years: 30.00    Pack years: 45.00    Types: Cigarettes  . Smokeless tobacco: Never Used  Substance Use Topics  . Alcohol use: No    Alcohol/week: 0.0 standard drinks  . Drug use: No    Review of Systems Constitutional: See HPI Eyes: No visual changes. ENT: No sore throat. No stiff neck no neck pain Cardiovascular: Denies chest pain. Respiratory: + shortness of breath. Gastrointestinal:   + vomiting.  + diarrhea.  No constipation. Genitourinary: Negative for dysuria. Musculoskeletal: Negative lower extremity swelling Skin: Negative for rash. Neurological: Negative for severe headaches, focal weakness or numbness.   ____________________________________________   PHYSICAL EXAM:  VITAL SIGNS: ED Triage Vitals  Enc Vitals Group     BP 06/04/18 1004 109/73     Pulse Rate 06/04/18 1004 (!) 108     Resp 06/04/18 1004 (!) 27     Temp 06/04/18 1004 98.4 F (36.9 C)     Temp Source 06/04/18 1004 Oral     SpO2 06/04/18 1004 98 %     Weight 06/04/18 1003 255 lb (115.7 kg)     Height 06/04/18 1003 5\' 11"  (1.803 m)     Head Circumference --      Peak Flow --      Pain Score 06/04/18 1003 0     Pain Loc --      Pain Edu? --      Excl. in GC? --     Constitutional: Alert and oriented.  [place] ill-appearing, does have some pursed lip breathing, which he states is his baseline  can speak in full sentences but is working a little superior Eyes: Conjunctivae are normal Head: Atraumatic HEENT: No congestion/rhinnorhea. Mucous membranes are moist.  Oropharynx non-erythematous Neck:   Nontender with no meningismus, no masses, no stridor Cardiovascular: Normal rate, regular rhythm. Grossly normal heart sounds.  Good peripheral circulation. Respiratory: Increased work of breathing, positive diffuse wheeze no rales or rhonchi Abdominal: Soft and nontender. No distention. No guarding no rebound Back:  There is no focal tenderness or step off.  there is no midline tenderness there  are no lesions noted. there is no CVA tenderness Musculoskeletal: No lower extremity tenderness, no upper extremity tenderness. No joint effusions, no DVT signs strong distal pulses no edema Neurologic:  Normal speech and language. No gross focal neurologic deficits are appreciated.  Skin:  Skin is warm, dry and intact. No rash noted. Psychiatric: Mood and affect are normal. Speech and behavior are normal.  ____________________________________________   LABS (all labs ordered are listed, but only abnormal results are displayed)  Labs Reviewed  CBC - Abnormal; Notable for the following components:      Result Value   WBC 14.8 (*)    All other components within normal limits  BASIC METABOLIC PANEL  TROPONIN I  BRAIN NATRIURETIC PEPTIDE  INFLUENZA PANEL BY PCR (TYPE A & B)    Pertinent labs  results that were available during my care of the patient were reviewed by me and considered in my medical decision making (see chart for details). ____________________________________________  EKG  I personally interpreted any EKGs ordered by me or triage Sinus rate 104, normal axis no acute ST elevation or depression, nonspecific ST changes. ____________________________________________  RADIOLOGY  Pertinent labs & imaging results that were available during my care of the patient were reviewed by  me and considered in my medical decision making (see chart for details). If possible, patient and/or family made aware of any abnormal findings.  Dg Chest Portable 1 View  Result Date: 06/04/2018 CLINICAL DATA:  Shortness of breath, tachycardia, history COPD, diabetes mellitus EXAM: PORTABLE CHEST 1 VIEW COMPARISON:  Portable exam 1015 hours compared to 08/16/2016 FINDINGS: Normal heart size, mediastinal contours, and pulmonary vascularity. Atherosclerotic calcification aorta. Bronchitic changes with minimal basilar atelectasis and chronic accentuation of basilar interstitial markings unchanged. No definite acute infiltrate, pleural effusion or pneumothorax. Osseous structures unremarkable. IMPRESSION: Bronchitic changes with mild basilar atelectasis and chronic basilar interstitial prominence. No acute abnormalities. Electronically Signed   By: Ulyses Southward M.D.   On: 06/04/2018 10:25   ____________________________________________    PROCEDURES  Procedure(s) performed: None  Procedures  Critical Care performed: CRITICAL CARE Performed by: Jeanmarie Plant   Total critical care time: 48 minutes  Critical care time was exclusive of separately billable procedures and treating other patients.  Critical care was necessary to treat or prevent imminent or life-threatening deterioration.  Critical care was time spent personally by me on the following activities: development of treatment plan with patient and/or surrogate as well as nursing, discussions with consultants, evaluation of patient's response to treatment, examination of patient, obtaining history from patient or surrogate, ordering and performing treatments and interventions, ordering and review of laboratory studies, ordering and review of radiographic studies, pulse oximetry and re-evaluation of patient's condition.   ____________________________________________   INITIAL IMPRESSION / ASSESSMENT AND PLAN / ED COURSE  Pertinent  labs & imaging results that were available during my care of the patient were reviewed by me and considered in my medical decision making (see chart for details).  Patient here with flu symptoms including vomiting, diarrhea, cough and wheeze, we are giving him nebulizers, steroids, fluids, x-ray does not show pneumonia.  Troponin is borderline however, this could certainly be demand ischemia he is not complaining of chest pain.  BNP is pending.  We are going to obtain cultures.  I suspect this is influenza more than sepsis but there is certainly multiple different possibilities for why this patient is sick and we will be aggressive with him.  At this time he would prefer  not to go on BiPAP and I think hopefully we can get him through without it.  However, if need be we will suggest that for him.  He will require admission.    ____________________________________________   FINAL CLINICAL IMPRESSION(S) / ED DIAGNOSES  Final diagnoses:  None      This chart was dictated using voice recognition software.  Despite best efforts to proofread,  errors can occur which can change meaning.     Jeanmarie Plant, MD 06/04/18 1106

## 2018-06-04 NOTE — ED Notes (Signed)
EDP McShane notified in person of Trop 0.11.

## 2018-06-04 NOTE — Progress Notes (Signed)
   06/04/18 1500  Clinical Encounter Type  Visited With Patient and family together  Visit Type Initial  Referral From Physician  Consult/Referral To Chaplain  Spiritual Encounters  Spiritual Needs Prayer;Emotional  Chaplain received a OR for AD. Chaplain arrived at patient room and introduced herself to patient and wife. Judeth Cornfield) Patient said he was in trouble because now it was two Anderson Coppock's. Chaplain and wife laughed. Chaplain asked patient was he interested in completing a AD. Patient said a what? Chaplain explained what a AD was and patient's wife explained to [place], how she have been trying to get patient to completed a AD for a while. Patient insist on not completing one. Patient was having a hard time breathing so Chaplain suggested visiting him tomorrow. Chaplain offered prayer and patient said he would love prayer, however the nurse came in and patient said we can wait. Chaplain will visit patient when she returns tomorrow. However, any Chaplain could visit him as well.

## 2018-06-04 NOTE — ED Triage Notes (Signed)
Pt arrives with concerns over shortness of breath and tachycardia. Pt's wife states she keeps a log and continually monitors pt's heart rate and oxygenation. HR was reported to be 138 per wife.

## 2018-06-04 NOTE — ED Notes (Addendum)
ED TO INPATIENT HANDOFF REPORT  ED Nurse Name and Phone #: Greta DoomGeorgie [phone]  S Name/Age/Gender Curtis MandrilBrian E Russell 63 y.o. male Room/Bed: ED02A/ED02A  Code Status   Code Status: DNR  Home/SNF/Other Home Patient oriented to: self, place, time and situation Is this baseline? Yes   Triage Complete: Triage complete  Chief Complaint sob  Triage Note Pt arrives with concerns over shortness of breath and tachycardia. Pt's wife states she keeps a log and continually monitors pt's heart rate and oxygenation. HR was reported to be 138 per wife.    Allergies Allergies  Allergen Reactions  . Bee Venom Anaphylaxis    Level of Care/Admitting Diagnosis ED Disposition    ED Disposition Condition Comment   Admit  Hospital Area: [facility] [[id number]]  Level of Care: Med-Surg [16]  Diagnosis: COPD exacerbation [facility](HCC) [[id number]][[id number]]  Admitting Physician: Ambrose PancoastMODY, SITAL [[id number]]  Attending Physician: MODY, Patricia PesaSITAL [[id number]]  Estimated length of stay: past midnight tomorrow  Certification:: I certify this patient will need inpatient services for at least 2 midnights  PT Class (Do Not Modify): Inpatient [101]  PT Acc Code (Do Not Modify): Private [1]       B Medical/Surgery History Past Medical History:  Diagnosis Date  . COPD (chronic obstructive pulmonary disease) (HCC)   . Diabetes mellitus without complication (HCC) 2014  . Hyperlipidemia   . Nasal polyp   . Pleurisy 2015  . Pneumonia   . Sleep apnea    Past Surgical History:  Procedure Laterality Date  . HERNIA REPAIR  2001  . HERNIA REPAIR     bil inguinal  . NASAL SINUS SURGERY    . RIGHT/LEFT HEART CATH AND CORONARY ANGIOGRAPHY Bilateral 12/11/2016   Procedure: RIGHT/LEFT HEART CATH AND CORONARY ANGIOGRAPHY;  Surgeon: Alwyn Peaallwood, Dwayne D, MD;  Location: [facility] INVASIVE CV LAB;  Service: Cardiovascular;  Laterality: Bilateral;     A IV Location/Drains/Wounds Patient Lines/Drains/Airways Status   Active  Line/Drains/Airways    Name:   Placement date:   Placement time:   Site:   Days:   Peripheral IV 06/04/18 Left Arm   06/04/18    1010    Arm   less than 1          Intake/Output Last 24 hours No intake or output data in the 24 hours ending 06/04/18 1615  Labs/Imaging Results for orders placed or performed during the hospital encounter of 06/04/18 (from the past 48 hour(s))  Brain natriuretic peptide     Status: None   Collection Time: 06/04/18 10:08 AM  Result Value Ref Range   B Natriuretic Peptide 74.0 0.0 - 100.0 pg/mL    Comment: Performed at [facility], [address] [place], [place] [address]  Basic metabolic panel     Status: Abnormal   Collection Time: 06/04/18 10:13 AM  Result Value Ref Range   Sodium 137 135 - 145 mmol/L   Potassium 3.5 3.5 - 5.1 mmol/L   Chloride 107 98 - 111 mmol/L   CO2 17 (L) 22 - 32 mmol/L   Glucose, Bld 119 (H) 70 - 99 mg/dL   BUN 30 (H) 8 - 23 mg/dL   Creatinine, Ser 4.251.80 (H) 0.61 - 1.24 mg/dL   Calcium 8.8 (L) 8.9 - 10.3 mg/dL   GFR calc non Af Amer 39 (L) >60 mL/min   GFR calc Af Amer 46 (L) >60 mL/min   Anion gap 13 5 - 15    Comment: Performed at [facility],  [address], [place], [place] [address]  CBC     Status: Abnormal   Collection Time: 06/04/18 10:13 AM  Result Value Ref Range   WBC 14.8 (H) 4.0 - 10.5 K/uL   RBC 5.09 4.22 - 5.81 MIL/uL   Hemoglobin 14.9 13.0 - 17.0 g/dL   HCT 08.6 57.8 - 46.9 %   MCV 85.5 80.0 - 100.0 fL   MCH 29.3 26.0 - 34.0 pg   MCHC 34.3 30.0 - 36.0 g/dL   RDW 62.9 52.8 - 41.3 %   Platelets 189 150 - 400 K/uL   nRBC 0.0 0.0 - 0.2 %    Comment: Performed at [facility], [address] [place], [place] [address]  Troponin I - ONCE - STAT     Status: Abnormal   Collection Time: 06/04/18 10:13 AM  Result Value Ref Range   Troponin I 0.11 (HH) <0.03 ng/mL    Comment: CRITICAL RESULT CALLED TO, READ BACK BY AND VERIFIED WITH  GEORGIE Larico Dimock AT 1104 06/04/18  SDR Performed at [facility], [address] [place], [place] [address]   Influenza panel by PCR (type A & B)     Status: None   Collection Time: 06/04/18 10:56 AM  Result Value Ref Range   Influenza A By PCR NEGATIVE NEGATIVE   Influenza B By PCR NEGATIVE NEGATIVE    Comment: (NOTE) The Xpert Xpress Flu assay is intended as an aid in the diagnosis of  influenza and should not be used as a sole basis for treatment.  This  assay is FDA approved for nasopharyngeal swab specimens only. Nasal  washings and aspirates are unacceptable for Xpert Xpress Flu testing. Performed at [facility], [address] [place], [place] [address]   Lactic acid, plasma     Status: None   Collection Time: 06/04/18 11:21 AM  Result Value Ref Range   Lactic Acid, Venous 1.8 0.5 - 1.9 mmol/L    Comment: Performed at [facility], [address] [place], [place] [address]  Glucose, capillary     Status: Abnormal   Collection Time: 06/04/18 12:54 PM  Result Value Ref Range   Glucose-Capillary 122 (H) 70 - 99 mg/dL  Troponin I - Now Then Q6H     Status: None   Collection Time: 06/04/18  1:00 PM  Result Value Ref Range   Troponin I <0.03 <0.03 ng/mL    Comment: Performed at [facility], [address] [place], [place] [address]   Dg Chest Portable 1 View  Result Date: 06/04/2018 CLINICAL DATA:  Shortness of breath, tachycardia, history COPD, diabetes mellitus EXAM: PORTABLE CHEST 1 VIEW COMPARISON:  Portable exam 1015 hours compared to 08/16/2016 FINDINGS: Normal heart size, mediastinal contours, and pulmonary vascularity. Atherosclerotic calcification aorta. Bronchitic changes with minimal basilar atelectasis and chronic accentuation of basilar interstitial markings unchanged. No definite acute infiltrate, pleural effusion or pneumothorax. Osseous structures unremarkable. IMPRESSION: Bronchitic changes with mild basilar atelectasis and chronic basilar interstitial  prominence. No acute abnormalities. Electronically Signed   By: Ulyses Southward M.D.   On: 06/04/2018 10:25    Pending Labs Unresulted Labs (From admission, onward)    Start     Ordered   06/11/18 0500  Creatinine, serum  (enoxaparin (LOVENOX)    CrCl >/= 30 ml/min)  Weekly,   STAT    Comments:  while on enoxaparin therapy    06/04/18 1142   06/05/18 0500  Basic metabolic panel  Tomorrow morning,  STAT     06/04/18 1142   06/05/18 0500  CBC  Tomorrow morning,   STAT     06/04/18 1142   06/04/18 1743  HIV Antibody (routine testing w rflx)  Once,   R     06/04/18 1743   06/04/18 1700  Troponin I - Once-Timed  Once-Timed,   STAT     06/04/18 1605   06/04/18 1143  Troponin I - Now Then Q6H  Now then every 6 hours,   STAT     06/04/18 1144   06/04/18 1106  Culture, blood (routine x 2)  BLOOD CULTURE X 2,   STAT     06/04/18 1105          Vitals/Pain Today's Vitals   06/04/18 1430 06/04/18 1445 06/04/18 1500 06/04/18 1530  BP:   (!) 117/53 128/60  Pulse: (!) 103  97 92  Resp: (!) 27 (!) 24 (!) 30 (!) 26  Temp:      TempSrc:      SpO2: 95%  93% 97%  Weight:      Height:      PainSc:        Isolation Precautions No active isolations  Medications Medications  ipratropium-albuterol (DUONEB) 0.5-2.5 (3) MG/3ML nebulizer solution 3 mL (3 mLs Nebulization Given 06/04/18 1543)  methylPREDNISolone sodium succinate (SOLU-MEDROL) 40 mg/mL injection 40 mg (0 mg Intravenous Hold 06/04/18 1244)  diphenhydrAMINE (BENADRYL) capsule 25 mg (25 mg Oral Given 06/04/18 1316)  traZODone (DESYREL) tablet 200 mg (has no administration in time range)  glipiZIDE (GLUCOTROL XL) 24 hr tablet 10 mg (has no administration in time range)  nicotine (NICODERM CQ - dosed in mg/24 hours) patch 14 mg (14 mg Transdermal Patch Applied 06/04/18 1319)  aspirin EC tablet 81 mg (81 mg Oral Given 06/04/18 1316)  mometasone-formoterol (DULERA) 200-5 MCG/ACT inhaler 2 puff (2 puffs Inhalation Given 06/04/18 1318)  insulin  aspart (novoLOG) injection 0-20 Units (3 Units Subcutaneous Given 06/04/18 1318)  insulin aspart (novoLOG) injection 0-5 Units (has no administration in time range)  enoxaparin (LOVENOX) injection 40 mg (40 mg Subcutaneous Given 06/04/18 1250)  acetaminophen (TYLENOL) tablet 650 mg (has no administration in time range)    Or  acetaminophen (TYLENOL) suppository 650 mg (has no administration in time range)  HYDROcodone-acetaminophen (NORCO/VICODIN) 5-325 MG per tablet 1-2 tablet (has no administration in time range)  polyethylene glycol (MIRALAX / GLYCOLAX) packet 17 g (has no administration in time range)  ondansetron (ZOFRAN) tablet 4 mg (has no administration in time range)    Or  ondansetron (ZOFRAN) injection 4 mg (has no administration in time range)  lisinopril (PRINIVIL,ZESTRIL) tablet 40 mg (40 mg Oral Given 06/04/18 1317)  hydrochlorothiazide (HYDRODIURIL) tablet 25 mg (25 mg Oral Given 06/04/18 1316)  cefTRIAXone (ROCEPHIN) 1 g in sodium chloride 0.9 % 100 mL IVPB (1 g Intravenous Not Given 06/04/18 1252)  azithromycin (ZITHROMAX) tablet 500 mg (has no administration in time range)  isosorbide mononitrate (IMDUR) 24 hr tablet 30 mg (has no administration in time range)  theophylline (THEODUR) 12 hr tablet 300 mg (has no administration in time range)  diltiazem (CARDIZEM CD) 24 hr capsule 120 mg (has no administration in time range)  atorvastatin (LIPITOR) tablet 10 mg (has no administration in time range)  sodium chloride flush (NS) 0.9 % injection 3 mL (3 mLs Intravenous Given 06/04/18 1010)  methylPREDNISolone sodium succinate (SOLU-MEDROL) 125 mg/2 mL injection 125 mg (125 mg Intravenous Given 06/04/18 1051)  albuterol (PROVENTIL) (2.5  MG/3ML) 0.083% nebulizer solution 5 mg (5 mg Nebulization Given 06/04/18 1052)  ipratropium (ATROVENT) nebulizer solution 0.5 mg (0.5 mg Nebulization Given 06/04/18 1052)  sodium chloride 0.9 % bolus 1,000 mL (0 mLs Intravenous Stopped 06/04/18 1300)   cefTRIAXone (ROCEPHIN) 1 g in sodium chloride 0.9 % 100 mL IVPB (0 g Intravenous Stopped 06/04/18 1320)  azithromycin (ZITHROMAX) 500 mg in sodium chloride 0.9 % 250 mL IVPB (0 mg Intravenous Stopped 06/04/18 1430)    Mobility walks Low fall risk   Focused Assessments Pulmonary Assessment Handoff:  Lung sounds: Bilateral Breath Sounds: Diminished, Expiratory wheezes O2 Device: Room Air        R Recommendations: See Admitting Provider Note  Report given to:   Additional Notes:  L arm 18g IV

## 2018-06-04 NOTE — Progress Notes (Signed)
Inpatient Diabetes Program Recommendations  AACE/ADA: New Consensus Statement on Inpatient Glycemic Control   Target Ranges:  Prepandial:   less than 140 mg/dL      Peak postprandial:   less than 180 mg/dL (1-2 hours)      Critically ill patients:  140 - 180 mg/dL   Results for Curtis Russell, Curtis Russell (MRN [id number]) as of 06/04/2018 13:46  Ref. Range 06/04/2018 12:54  Glucose-Capillary Latest Ref Range: 70 - 99 mg/dL 470 (H)    Review of Glycemic Control  Diabetes history: DM2 Outpatient Diabetes medications: Trulicity 1.5 mg Qweek (Tuesday), Invokana 300 mg QAM, Glipizide XL 10 mg QPM, Metformin 1000 mg BID Current orders for Inpatient glycemic control: Novolog 0-20 units TID with meals, Novolog 0-5 units QHS, Invokana 300 mg QAM, Glipizide XL 10 mg QPM; Solumedrol 40 mg Q12H  Inpatient Diabetes Program Recommendations:   HbgA1C:Per Care Everywhere, A1C 6.7% on 03/18/2018.  May want to consider ordering an A1C to evaluate glycemic control over the past 2-3 months.  Oral DM medications: If patient experiences any hypoglycemia, please consider discontinuing Glipizide and Invokana while inpatient.  NOTE: Noted consult for Diabetes Coordinator. Chart reviewed. Patient is currently in the Emergency Department and being admitted. Noted patient is followed by Dr. Tedd Sias (Endocrinology) and was last seen on 03/25/18. Per office note by Dr. Tedd Sias on 03/25/18, DM medications prescribed exactly as noted above. Will follow glucose trends and make further recommendations if needed as more data is collected.  Thanks, Orlando Penner, RN, MSN, CDE Diabetes Coordinator Inpatient Diabetes Program [phone] (Team Pager from 8am to 5pm)

## 2018-06-04 NOTE — Progress Notes (Signed)
Family Meeting Note  Advance Directive:no  Today a meeting took place with the Patient.spouse  The following clinical team members were present during this meeting:MD  The following were discussed:Patient's diagnosis: COPD exacerbation and community-acquired pneumonia, Patient's progosis: Unable to determine and Goals for treatment: DNR  Additional follow-up to be provided: chaplain to create AD  Time spent during discussion:16 minutes  Curtis Russell, Patricia Pesa, MD

## 2018-06-04 NOTE — ED Notes (Signed)
Lab called for flu swabs

## 2018-06-04 NOTE — Progress Notes (Signed)
PHARMACIST - PHYSICIAN ORDER COMMUNICATION  CONCERNING: Diltiazem and Simvastatin  and risk of rhabdomyolysis  DESCRIPTION:  Patients on diltiazem and simvastatin >10 mg/day have reported cases of rhabdomyolysis. Pharmacy is to assess simvastatin dose. If >10 mg, substitute atorvastatin (Lipitor) 1mg  for each 2mg  simvastatin.  This patient is ordered simvastatin 20mg  and diltiazem 120mg .    ACTION TAKEN: Per protocol pharmacy has discontinued the patient's order for simvastatin and replaced it with Atorvastatin 10 mg.    Lowella Bandy, PharmD Clinical Pharmacist 06/04/2018 2:08 PM

## 2018-06-04 NOTE — Progress Notes (Signed)
Patient admitted to unit. VSS, no pain. Educated on use of IS, patient refusing to use. Educated on use of bed alarm for safey. Patient states he probably will not callwhen he needs to get up and asked this writer to not turn on bed alarm. Re informed patient that it is hospital safety policy to use bed alarm.

## 2018-06-04 NOTE — H&P (Addendum)
[facility] [place] [facility]   PATIENT NAME: Curtis Russell    MR#:  [id number]  DATE OF BIRTH:  03/13/1956  DATE OF ADMISSION:  06/04/2018  PRIMARY CARE PHYSICIAN: Jerl MinaHedrick, James, MD   REQUESTING/REFERRING PHYSICIAN: dr Alphonzo Lemmingsmcshane  CHIEF COMPLAINT:   Sob and malaise HISTORY OF PRESENT ILLNESS:  Curtis Russell  is a 63 y.o. male with a known history of sleep apnea on CPAP at night, COPD on oxygen at night who presents to the emergency room due to fever, malaise and shortness of breath.  Patient has chronic shortness of breath and only ambulates about 10 feet on a daily basis however over the past 3 days his symptoms have worsened.  He also endorses cough and chills.  He does not have body aches.  His cough is nonproductive in nature.  He reports a fever of up to 102 yesterday. Chest x-ray in the emergency room does not show pneumonia.  Influenza testing is negative.  PAST MEDICAL HISTORY:   Past Medical History:  Diagnosis Date  . COPD (chronic obstructive pulmonary disease) (HCC)   . Diabetes mellitus without complication (HCC) 2014  . Hyperlipidemia   . Nasal polyp   . Pleurisy 2015  . Pneumonia   . Sleep apnea     PAST SURGICAL HISTORY:   Past Surgical History:  Procedure Laterality Date  . HERNIA REPAIR  2001  . HERNIA REPAIR     bil inguinal  . NASAL SINUS SURGERY    . RIGHT/LEFT HEART CATH AND CORONARY ANGIOGRAPHY Bilateral 12/11/2016   Procedure: RIGHT/LEFT HEART CATH AND CORONARY ANGIOGRAPHY;  Surgeon: Alwyn Peaallwood, Dwayne D, MD;  Location: [facility] INVASIVE CV LAB;  Service: Cardiovascular;  Laterality: Bilateral;    SOCIAL HISTORY:   Social History   Tobacco Use  . Smoking status: Current Every Day Smoker    Packs/day: 1.50    Years: 30.00    Pack years: 45.00    Types: Cigarettes  . Smokeless tobacco: Never Used  Substance Use Topics  . Alcohol use: No    Alcohol/week: 0.0 standard drinks    FAMILY HISTORY:   Family History  Problem  Relation Age of Onset  . COPD Mother   . Diabetes Father     DRUG ALLERGIES:   Allergies  Allergen Reactions  . Bee Venom Anaphylaxis    REVIEW OF SYSTEMS:   Review of Systems  Constitutional: Positive for chills, fever and malaise/fatigue.  HENT: Negative.  Negative for ear discharge, ear pain, Russell loss, nosebleeds and sore throat.   Eyes: Negative.  Negative for blurred vision and pain.  Respiratory: Positive for cough, shortness of breath and wheezing. Negative for hemoptysis.   Cardiovascular: Negative.  Negative for chest pain, palpitations and leg swelling.  Gastrointestinal: Positive for diarrhea. Negative for abdominal pain, blood in stool, nausea and vomiting.  Genitourinary: Negative.  Negative for dysuria.  Musculoskeletal: Negative.  Negative for back pain.  Skin: Negative.   Neurological: Negative for dizziness, tremors, speech change, focal weakness, seizures and headaches.  Endo/Heme/Allergies: Negative.  Does not bruise/bleed easily.  Psychiatric/Behavioral: Negative.  Negative for depression, hallucinations and suicidal ideas.    MEDICATIONS AT HOME:   Prior to Admission medications   Medication Sig Start Date End Date Taking? Authorizing Provider  aspirin EC 81 MG tablet Take 81 mg by mouth daily.    [provider]  budesonide-formoterol (SYMBICORT) 160-4.5 MCG/ACT inhaler Inhale 2 puffs into the lungs 2 (two) times daily.    [provider]  canagliflozin (INVOKANA) 300 MG TABS tablet Take 300 mg by mouth daily before breakfast.    [provider]  diphenhydrAMINE (SOMINEX) 25 MG tablet Take 25 mg by mouth 2 (two) times daily.     [provider]  glipiZIDE (GLUCOTROL XL) 10 MG 24 hr tablet Take 10 mg by mouth every evening.     [provider]  Investigational - Study Medication Inhale 2 puffs into the lungs 2 (two) times daily. Study name:Qvar? Additional study details:[facility]     [provider]  ipratropium-albuterol (DUONEB) 0.5-2.5 (3) MG/3ML SOLN Take 3 mLs by nebulization 4 (four) times daily.     [provider]  lisinopril-hydrochlorothiazide (PRINZIDE,ZESTORETIC) 20-12.5 MG tablet Take 2 tablets by mouth daily.    [provider]  metFORMIN (GLUCOPHAGE) 500 MG tablet Take 1,000 mg by mouth 2 (two) times daily with a meal.    [provider]  nicotine (NICODERM CQ - DOSED IN MG/24 HOURS) 14 mg/24hr patch Place 1 patch (14 mg total) onto the skin daily. Patient not taking: Reported on 12/09/2016 08/19/16   Adrian Saran, MD  predniSONE (DELTASONE) 10 MG tablet Take 1 tablet (10 mg total) by mouth daily with breakfast. 50 mg PO (ORAL)  x 2 days 40 mg PO (ORAL)  x 2 days 30 mg PO  (ORAL)  x 2 days 20 mg PO  (ORAL) x 2 days 10 mg PO  (ORAL) x 2 days then stop Patient not taking: Reported on 12/09/2016 08/19/16   Adrian Saran, MD  simvastatin (ZOCOR) 20 MG tablet TAKE ONE TABLET BY MOUTH AT BEDTIME 01/10/15   [provider]  traZODone (DESYREL) 50 MG tablet Take 200 mg by mouth at bedtime.     [provider]  TRULICITY 1.5 MG/0.5ML SOPN Inject 1.5 mg into the skin every Tuesday. 11/06/16   [provider]      VITAL SIGNS:  Blood pressure (!) 101/59, pulse 93, temperature 98.4 F (36.9 C), temperature source Oral, resp. rate (!) 27, height 5\' 11"  (1.803 m), weight 115.7 kg, SpO2 97 %.  PHYSICAL EXAMINATION:   Physical Exam Constitutional:      General: He is not in acute distress. HENT:     Head: Normocephalic.  Eyes:     General: No scleral icterus. Neck:     Musculoskeletal: Normal range of motion and neck supple.     Vascular: No JVD.     Trachea: No tracheal deviation.  Cardiovascular:     Rate and Rhythm: Normal rate and regular rhythm.     Heart sounds: Normal heart sounds. No murmur. No friction rub. No gallop.   Pulmonary:     Effort: Pulmonary effort is normal. No respiratory distress.      Breath sounds: Wheezing present. No rales.  Chest:     Chest wall: No tenderness.  Abdominal:     General: Bowel sounds are normal. There is no distension.     Palpations: Abdomen is soft. There is no mass.     Tenderness: There is no abdominal tenderness. There is no guarding or rebound.  Musculoskeletal: Normal range of motion.  Skin:    General: Skin is warm.     Findings: No erythema or rash.  Neurological:     Mental Status: He is alert and oriented to person, place, and time.  Psychiatric:        Judgment: Judgment normal.       LABORATORY PANEL:  CBC Recent Labs  Lab 06/04/18 1013  WBC 14.8*  HGB 14.9  HCT 43.5  PLT 189   ------------------------------------------------------------------------------------------------------------------  Chemistries  Recent Labs  Lab 06/04/18 1013  NA 137  K 3.5  CL 107  CO2 17*  GLUCOSE 119*  BUN 30*  CREATININE 1.80*  CALCIUM 8.8*   ------------------------------------------------------------------------------------------------------------------  Cardiac Enzymes Recent Labs  Lab 06/04/18 1013  TROPONINI 0.11*   ------------------------------------------------------------------------------------------------------------------  RADIOLOGY:  Dg Chest Portable 1 View  Result Date: 06/04/2018 CLINICAL DATA:  Shortness of breath, tachycardia, history COPD, diabetes mellitus EXAM: PORTABLE CHEST 1 VIEW COMPARISON:  Portable exam 1015 hours compared to 08/16/2016 FINDINGS: Normal heart size, mediastinal contours, and pulmonary vascularity. Atherosclerotic calcification aorta. Bronchitic changes with minimal basilar atelectasis and chronic accentuation of basilar interstitial markings unchanged. No definite acute infiltrate, pleural effusion or pneumothorax. Osseous structures unremarkable. IMPRESSION: Bronchitic changes with mild basilar atelectasis and chronic basilar interstitial prominence. No acute abnormalities.  Electronically Signed   By: Ulyses Southward M.D.   On: 06/04/2018 10:25    EKG:  Sinus tachycardia no ST elevation or depression  IMPRESSION AND PLAN:   63 year old male with a history of COPD and OSA with ongoing tobacco abuse who presents with shortness of breath and malaise.  1.  Acute exacerbation of COPD: Solu-Medrol 40 mg IV every 12 hours Continue nebs and inhalers  2.  Community-acquired pneumonia: Patient symptoms are suggestive of pneumonia Empiric treatment with Rocephin and azithromycin for total 5 days Incentive spirometer  3.  OSA: Continue CPAP at night  4.Tobacco dependence: Patient is encouraged to quit smoking. Counseling was provided for 4 minutes. 5.  Acute kidney injury: Hold nephrotoxic medications Gentle hydration and repeat BMP in a.m.  6.  Diabetes: Continue outpatient regimen with exception of metformin with ADA diet Sliding scale Diabetes nurse consult  7.  Hyperlipidemia: Continue statin   8.  Elevated troponin in the setting of demand ischemia: Follow telemetry and troponins.  Dr. Juliann Pares consult if needed.  All the records are reviewed and case discussed with ED provider. Management plans discussed with the patient and he is in agreement  CODE STATUS: dnr  TOTAL TIME TAKING CARE OF THIS PATIENT: 46 minutes.    Moustafa Mossa M.D on 06/04/2018 at 12:06 PM  Between 7am to 6pm - Pager - [phone]  After 6pm go to [email or web address] - Social research officer, government  [facility]  Office  [phone]  CC: Primary care physician; Jerl Mina, MD

## 2018-06-05 LAB — MAGNESIUM: Magnesium: 2.2 mg/dL (ref 1.7–2.4)

## 2018-06-05 LAB — CBC
HCT: 39.9 % (ref 39.0–52.0)
Hemoglobin: 13.5 g/dL (ref 13.0–17.0)
MCH: 29 pg (ref 26.0–34.0)
MCHC: 33.8 g/dL (ref 30.0–36.0)
MCV: 85.8 fL (ref 80.0–100.0)
Platelets: 179 10*3/uL (ref 150–400)
RBC: 4.65 MIL/uL (ref 4.22–5.81)
RDW: 13.7 % (ref 11.5–15.5)
WBC: 11.1 10*3/uL — ABNORMAL HIGH (ref 4.0–10.5)
nRBC: 0 % (ref 0.0–0.2)

## 2018-06-05 LAB — GLUCOSE, CAPILLARY
Glucose-Capillary: 244 mg/dL — ABNORMAL HIGH (ref 70–99)
Glucose-Capillary: 178 mg/dL — ABNORMAL HIGH (ref 70–99)
Glucose-Capillary: 253 mg/dL — ABNORMAL HIGH (ref 70–99)
Glucose-Capillary: 196 mg/dL — ABNORMAL HIGH (ref 70–99)

## 2018-06-05 LAB — BASIC METABOLIC PANEL
Anion gap: 11 (ref 5–15)
BUN: 42 mg/dL — AB (ref 8–23)
CO2: 18 mmol/L — ABNORMAL LOW (ref 22–32)
Calcium: 8.4 mg/dL — ABNORMAL LOW (ref 8.9–10.3)
Chloride: 110 mmol/L (ref 98–111)
Creatinine, Ser: 1.77 mg/dL — ABNORMAL HIGH (ref 0.61–1.24)
GFR calc Af Amer: 47 mL/min — ABNORMAL LOW (ref >60)
GFR calc non Af Amer: 40 mL/min — ABNORMAL LOW (ref >60)
Glucose, Bld: 241 mg/dL — ABNORMAL HIGH (ref 70–99)
Potassium: 3.1 mmol/L — ABNORMAL LOW (ref 3.5–5.1)
SODIUM: 139 mmol/L (ref 135–145)

## 2018-06-05 LAB — TROPONIN I: Troponin I: <0.03 ng/mL (ref <0.03)

## 2018-06-05 MED ORDER — ENSURE MAX PROTEIN PO LIQD
11.0000 [oz_av] | Freq: Two times a day (BID) | ORAL | Status: DC
  Administered 2018-06-05 – 2018-06-07 (×5): 11 [oz_av] via ORAL

## 2018-06-05 MED ORDER — GUAIFENESIN-DM 100-10 MG/5ML PO SYRP
5.0000 mL | ORAL_SOLUTION | Freq: Four times a day (QID) | ORAL | Status: DC | PRN
  Administered 2018-06-05 – 2018-06-06 (×3): 5 mL via ORAL
  Filled 2018-06-05 (×3): qty 5

## 2018-06-05 MED ORDER — POTASSIUM CHLORIDE CRYS ER 20 MEQ PO TBCR
40.0000 meq | EXTENDED_RELEASE_TABLET | Freq: Once | ORAL | Status: AC
  Administered 2018-06-05: 40 meq via ORAL
  Filled 2018-06-05: qty 2

## 2018-06-05 NOTE — Progress Notes (Signed)
[facility] - [place] at [facility]   PATIENT NAME: Curtis Russell    MR#:  [id number]  DATE OF BIRTH:  19-Oct-1955  SUBJECTIVE:  CHIEF COMPLAINT:   Chief Complaint  Patient presents with  . Shortness of Breath  SOB + REVIEW OF SYSTEMS:  Review of Systems  Constitutional: Negative for diaphoresis, fever, malaise/fatigue and weight loss.  HENT: Negative for ear discharge, ear pain, hearing loss, nosebleeds, sore throat and tinnitus.   Eyes: Negative for blurred vision and pain.  Respiratory: Positive for shortness of breath. Negative for cough, hemoptysis and wheezing.   Cardiovascular: Negative for chest pain, palpitations, orthopnea and leg swelling.  Gastrointestinal: Negative for abdominal pain, blood in stool, constipation, diarrhea, heartburn, nausea and vomiting.  Genitourinary: Negative for dysuria, frequency and urgency.  Musculoskeletal: Negative for back pain and myalgias.  Skin: Negative for itching and rash.  Neurological: Negative for dizziness, tingling, tremors, focal weakness, seizures, weakness and headaches.  Psychiatric/Behavioral: Negative for depression. The patient is not nervous/anxious.    DRUG ALLERGIES:   Allergies  Allergen Reactions  . Bee Venom Anaphylaxis   VITALS:  Blood pressure (!) 121/59, pulse 87, temperature 97.6 F (36.4 C), temperature source Oral, resp. rate 16, height 5\' 11"  (1.803 m), weight 115.7 kg, SpO2 93 %. PHYSICAL EXAMINATION:  Physical Exam HENT:     Head: Normocephalic and atraumatic.  Eyes:     Conjunctiva/sclera: Conjunctivae normal.     Pupils: Pupils are equal, round, and reactive to light.  Neck:     Musculoskeletal: Normal range of motion and neck supple.     Thyroid: No thyromegaly.     Trachea: No tracheal deviation.  Cardiovascular:     Rate and Rhythm: Normal rate and regular rhythm.     Heart sounds: Normal heart sounds.  Pulmonary:     Effort: Pulmonary effort is normal. No respiratory  distress.     Breath sounds: Decreased breath sounds present. No wheezing.  Chest:     Chest wall: No tenderness.  Abdominal:     General: Bowel sounds are normal. There is no distension.     Palpations: Abdomen is soft.     Tenderness: There is no abdominal tenderness.  Musculoskeletal: Normal range of motion.  Skin:    General: Skin is warm and dry.     Findings: No rash.  Neurological:     Mental Status: He is alert and oriented to person, place, and time.     Cranial Nerves: No cranial nerve deficit.    LABORATORY PANEL:  Male CBC Recent Labs  Lab 06/05/18 0520  WBC 11.1*  HGB 13.5  HCT 39.9  PLT 179   ------------------------------------------------------------------------------------------------------------------ Chemistries  Recent Labs  Lab 06/05/18 0520  NA 139  K 3.1*  CL 110  CO2 18*  GLUCOSE 241*  BUN 42*  CREATININE 1.77*  CALCIUM 8.4*   RADIOLOGY:  No results found. ASSESSMENT AND PLAN:  63 year old male with a history of COPD and OSA with ongoing tobacco abuse admitted with shortness of breath and malaise.  1.  Acute exacerbation of COPD: Solu-Medrol 40 mg IV every 12 hours Continue nebs and inhalers  2.  Community-acquired pneumonia:  Empiric treatment with Rocephin and azithromycin for total 5 days Incentive spirometer  3. Hypokalemia: replete and recheck, check mg  4.Tobacco dependence: Patient is encouraged to quit smoking. Counseling was provided for 4 minutes.  5.  Acute kidney injury: Hold nephrotoxic medications, creat 1.7 Gentle hydration and repeat BMP  in a.m.  6.  Diabetes: Continue outpatient regimen with exception of metformin with ADA diet Sliding scale, Diabetes nurse consult  7.  Hyperlipidemia: Continue statin  8.  Elevated troponin in the setting of demand ischemia - neg serial troponins  9.  OSA: Continue CPAP at night   D/C tele   All the records are reviewed and case discussed with Care  Management/Social Worker. Management plans discussed with the patient, nursing and they are in agreement.  CODE STATUS: DNR  TOTAL TIME TAKING CARE OF THIS PATIENT: 35 minutes.   More than 50% of the time was spent in counseling/coordination of care: YES  POSSIBLE D/C IN 1-2 DAYS, DEPENDING ON CLINICAL CONDITION.   Delfino Lovett M.D on 06/05/2018 at 1:53 PM  Between 7am to 6pm - Pager - [phone]  After 6pm go to [email or web address] - Social research officer, government  [facility] [place] [facility]  Office  [phone]  CC: Primary care physician; Jerl Mina, MD  Note: This dictation was prepared with Dragon dictation along with smaller phrase technology. Any transcriptional errors that result from this process are unintentional.

## 2018-06-05 NOTE — Progress Notes (Signed)
Initial Nutrition Assessment  DOCUMENTATION CODES:   Obesity unspecified  INTERVENTION:   - Ensure Max po BID, each supplement provides 150 kcal and 30 grams of protein  - Double protein portions with all meals  NUTRITION DIAGNOSIS:   Increased nutrient needs related to chronic illness (COPD) as evidenced by estimated needs.  GOAL:   Patient will meet greater than or equal to 90% of their needs  MONITOR:   PO intake, Supplement acceptance, Labs, Weight trends  REASON FOR ASSESSMENT:   Malnutrition Screening Tool    ASSESSMENT:   63 year old male who presented to the ED on 2/20 with SOB and malaise. PMH significant for sleep apnea on CPAP at night, COPD on oxygen at night, HLD, DM, tobacco use. Pt admitted with CAP and COPD exacerbation.  Spoke with pt and wife at bedside. Pt reports feeling "famished" this morning before breakfast and also shares that he barely ate during the last 3 days due to illness.  Pt and wife share that during the day, pt does not eat "full meals" but "nibbles, grazes" throughout the day. Pt states that he loves vegetables and fruit like grapes and mandarin oranges. Pt also eats peanut butter crackers.  Pt endorses weight loss over the last week. Pt reports his UBW as 265 lbs and that he weighed 251 lbs on Sunday (2/16) and 248 lbs on Monday (2/17). Pt and his wife keep very detailed records of CBG's, blood pressure, oxygen levels, inhaler usage, weights, etc.  Per weight history in chart, pt's weight has been fairly stable over the last 2 years. However, data is limited with only 3 weights recorded between 08/17/26 and this admission. Weight of 255 lbs on admission appears to be stated rather than measured.  Pt and wife state that pt's blood sugars usually run between 80-85 at home.  Meal Completion: 100% x 1 meal  Medications reviewed and include: Glucotrol, SSI, Solu-medrol, IV antibiotics  Labs reviewed: potassium 3.1 (L), BUN 42 (H),  creatinine 1.77 (H) CBG's: 244, 256, 199 x 24 hours  NUTRITION - FOCUSED PHYSICAL EXAM:    Most Recent Value  Orbital Region  No depletion  Upper Arm Region  Mild depletion  Thoracic and Lumbar Region  No depletion  Buccal Region  No depletion  Temple Region  No depletion  Clavicle Bone Region  No depletion  Clavicle and Acromion Bone Region  Mild depletion  Scapular Bone Region  Mild depletion  Dorsal Hand  No depletion  Patellar Region  No depletion  Anterior Thigh Region  Mild depletion  Posterior Calf Region  Mild depletion  Edema (RD Assessment)  None  Hair  Reviewed  Eyes  Reviewed  Mouth  Reviewed  Skin  Reviewed  Nails  Reviewed       Diet Order:   Diet Order            Diet Carb Modified Fluid consistency: Thin; Room service appropriate? Yes  Diet effective now              EDUCATION NEEDS:   Education needs have been addressed  Skin:  Skin Assessment: Reviewed RN Assessment  Last BM:  2/20 small type 2  Height:   Ht Readings from Last 1 Encounters:  06/04/18 5\' 11"  (1.803 m)    Weight:   Wt Readings from Last 1 Encounters:  06/04/18 115.7 kg    Ideal Body Weight:  78.2 kg  BMI:  Body mass index is 35.57 kg/m.  Estimated Nutritional Needs:  Kcal:  2200-2400  Protein:  115-130 grams  Fluid:  >/= 2.2 L    Earma Reading, MS, RD, LDN Inpatient Clinical Dietitian Pager: [phone] Weekend/After Hours: [phone]

## 2018-06-05 NOTE — Care Management Note (Signed)
Case Management Note  Patient Details  Name: Curtis Russell MRN: [id number] Date of Birth: 06/19/1955  Subjective/Objective:                   Met with the patient and his wife, He stated that he does not want or need HH, He currently own his own Oxygen concentrator and portable tanks  I offered to the patient to set up with [facility] and he declines, IOffered to set him up with DME walker and BSW, he declines and said he didn't need what DME we can provide he wants a stand up walker that insurance does not cover and he stated that his sone are going to get things for him  Patient uses oxygen at home at night Patient does not ambulate at home except approx. 10 ft at a time, he has enough Tubing to reach around the whole house  Action/Plan: Declines HH services and DME  Expected Discharge Date:  06/07/18               Expected Discharge Plan:     In-House Referral:     Discharge planning Services  CM Consult  Post Acute Care Choice:    Choice offered to:     DME Arranged:    DME Agency:     HH Arranged:    [place] Agency:     Status of Service:  In process, will continue to follow  If discussed at Long Length of Stay Meetings, dates discussed:    Additional Comments:  Su Hilt, RN 06/05/2018, 3:31 PM

## 2018-06-05 NOTE — Progress Notes (Signed)
Patient takes invokana at home. Per pharmacy we do not carry. Patient asking wife to bring from home.

## 2018-06-06 LAB — GLUCOSE, CAPILLARY
GLUCOSE-CAPILLARY: 246 mg/dL — AB (ref 70–99)
GLUCOSE-CAPILLARY: 199 mg/dL — AB (ref 70–99)
Glucose-Capillary: 266 mg/dL — ABNORMAL HIGH (ref 70–99)
Glucose-Capillary: 193 mg/dL — ABNORMAL HIGH (ref 70–99)

## 2018-06-06 LAB — BASIC METABOLIC PANEL
Anion gap: 11 (ref 5–15)
BUN: 48 mg/dL — AB (ref 8–23)
CO2: 19 mmol/L — ABNORMAL LOW (ref 22–32)
CREATININE: 1.55 mg/dL — AB (ref 0.61–1.24)
Calcium: 8.8 mg/dL — ABNORMAL LOW (ref 8.9–10.3)
Chloride: 108 mmol/L (ref 98–111)
GFR calc Af Amer: 55 mL/min — ABNORMAL LOW (ref >60)
GFR calc non Af Amer: 47 mL/min — ABNORMAL LOW (ref >60)
Glucose, Bld: 289 mg/dL — ABNORMAL HIGH (ref 70–99)
Potassium: 3.9 mmol/L (ref 3.5–5.1)
Sodium: 138 mmol/L (ref 135–145)

## 2018-06-06 LAB — HIV ANTIBODY (ROUTINE TESTING W REFLEX): HIV Screen 4th Generation wRfx: NONREACTIVE

## 2018-06-06 LAB — CBC
HCT: 39.9 % (ref 39.0–52.0)
Hemoglobin: 13.4 g/dL (ref 13.0–17.0)
MCH: 29 pg (ref 26.0–34.0)
MCHC: 33.6 g/dL (ref 30.0–36.0)
MCV: 86.4 fL (ref 80.0–100.0)
Platelets: 188 10*3/uL (ref 150–400)
RBC: 4.62 MIL/uL (ref 4.22–5.81)
RDW: 13.7 % (ref 11.5–15.5)
WBC: 13.8 10*3/uL — ABNORMAL HIGH (ref 4.0–10.5)
nRBC: 0 % (ref 0.0–0.2)

## 2018-06-06 MED ORDER — CALCIUM CARBONATE ANTACID 500 MG PO CHEW
1.0000 | CHEWABLE_TABLET | Freq: Every day | ORAL | Status: DC
  Administered 2018-06-06 – 2018-06-07 (×2): 200 mg via ORAL
  Filled 2018-06-06 (×2): qty 1

## 2018-06-06 MED ORDER — GUAIFENESIN-DM 100-10 MG/5ML PO SYRP
5.0000 mL | ORAL_SOLUTION | ORAL | Status: DC | PRN
  Administered 2018-06-06 – 2018-06-07 (×3): 5 mL via ORAL
  Filled 2018-06-06 (×3): qty 5

## 2018-06-06 NOTE — Progress Notes (Signed)
[facility] - [place] at [facility]   PATIENT NAME: Curtis Russell    MR#:  [id number]  DATE OF BIRTH:  04-10-1956  SUBJECTIVE:  CHIEF COMPLAINT:   Chief Complaint  Patient presents with  . Shortness of Breath   No new complaints.  Patient weaned off oxygen this morning.  Shortness of breath continues to improve.  No fevers.  Updated wife present at bedside.  REVIEW OF SYSTEMS:  Review of Systems  Constitutional: Negative for chills and fever.  HENT: Negative for hearing loss and tinnitus.   Eyes: Negative for blurred vision and double vision.  Respiratory: Positive for cough and shortness of breath.   Cardiovascular: Negative for chest pain and palpitations.  Gastrointestinal: Negative for heartburn and nausea.  Genitourinary: Negative for dysuria and urgency.  Musculoskeletal: Negative for back pain and myalgias.  Skin: Negative for itching and rash.  Neurological: Negative for dizziness and headaches.  Psychiatric/Behavioral: Negative for depression and hallucinations.    DRUG ALLERGIES:   Allergies  Allergen Reactions  . Bee Venom Anaphylaxis   VITALS:  Blood pressure 128/79, pulse 86, temperature 98.1 F (36.7 C), temperature source Oral, resp. rate (!) 22, height 5\' 11"  (1.803 m), weight 115.7 kg, SpO2 97 %. PHYSICAL EXAMINATION:   Physical Exam  Constitutional: He is oriented to person, place, and time and well-developed, well-nourished, and in no distress. No distress.  HENT:  Head: Normocephalic and atraumatic.  Right Ear: External ear normal.  Eyes: Conjunctivae and EOM are normal.  Neck: Normal range of motion. Neck supple.  Cardiovascular: Normal rate and regular rhythm.  Pulmonary/Chest: Effort normal.  Mild wheeze  Abdominal: Soft. Bowel sounds are normal. He exhibits no distension. There is no abdominal tenderness.  Musculoskeletal: Normal range of motion.        General: No edema.  Neurological: He is alert and oriented to person,  place, and time.  Skin: Skin is warm. No erythema.  Psychiatric: Affect and judgment normal.   LABORATORY PANEL:  Male CBC Recent Labs  Lab 06/06/18 0519  WBC 13.8*  HGB 13.4  HCT 39.9  PLT 188   ------------------------------------------------------------------------------------------------------------------ Chemistries  Recent Labs  Lab 06/05/18 0528 06/06/18 0519  NA  --  138  K  --  3.9  CL  --  108  CO2  --  19*  GLUCOSE  --  289*  BUN  --  48*  CREATININE  --  1.55*  CALCIUM  --  8.8*  MG 2.2  --    RADIOLOGY:  No results found. ASSESSMENT AND PLAN:   63 year old male with a history of COPD and OSA with ongoing tobacco abuse admitted with shortness of breath and malaise.  1. Acute exacerbation of COPD: Solu-Medrol 40 mg IV every 12 hours with plans to transition to p.o. prednisone taper on discharge Continue nebs and inhalers  2. Community-acquired pneumonia:  Empiric treatment with Rocephin and azithromycin for total 5 days Incentive spirometer  3. Hypokalemia: repleted  4.Tobacco dependence: Patient is encouraged to quit smoking. Counseling was provided for 4 minutes.  5. Acute kidney injury: Hold nephrotoxic medications,  Renal function continues to improve.  6. Diabetes: Continue outpatient regimen with exception of metformin with ADA diet Sliding scale, Diabetes nurse consult  7. Hyperlipidemia: Continue statin  8. Elevated troponin in the setting of demand ischemia - neg serial troponins  9.  OSA: Continue CPAP at night  DVT prophylaxis; Lovenox    All the records are reviewed and case  discussed with Care Management/Social Worker. Management plans discussed with the patient, family and they are in agreement.  CODE STATUS: DNR  TOTAL TIME TAKING CARE OF THIS PATIENT: 26 minutes.   More than 50% of the time was spent in counseling/coordination of care: YES  POSSIBLE D/C IN 1-2 DAYS, DEPENDING ON CLINICAL  CONDITION.   Tenaya Hilyer M.D on 06/06/2018 at 1:23 PM  Between 7am to 6pm - Pager - [phone]  After 6pm go to [email or web address] - Social research officer, government  [facility] [place] [facility]  Office  [phone]  CC: Primary care physician; Jerl Mina, MD  Note: This dictation was prepared with Dragon dictation along with smaller phrase technology. Any transcriptional errors that result from this process are unintentional.

## 2018-06-06 NOTE — Plan of Care (Signed)
  Problem: Clinical Measurements: Goal: Ability to maintain clinical measurements within normal limits will improve Outcome: Progressing Goal: Will remain free from infection Outcome: Progressing Goal: Diagnostic test results will improve Outcome: Progressing Goal: Respiratory complications will improve Outcome: Progressing Goal: Cardiovascular complication will be avoided Outcome: Progressing   Problem: Activity: Goal: Risk for activity intolerance will decrease Outcome: Progressing   Problem: Coping: Goal: Level of anxiety will decrease Outcome: Progressing   Problem: Pain Managment: Goal: General experience of comfort will improve Outcome: Progressing   Problem: Safety: Goal: Ability to remain free from injury will improve Outcome: Progressing   

## 2018-06-06 NOTE — Progress Notes (Signed)
Pt said he was going to take a shower, but then changed his mind saying he would shower tomorrow.

## 2018-06-07 LAB — BASIC METABOLIC PANEL
Anion gap: 11 (ref 5–15)
BUN: 44 mg/dL — ABNORMAL HIGH (ref 8–23)
CO2: 19 mmol/L — ABNORMAL LOW (ref 22–32)
CREATININE: 1.33 mg/dL — AB (ref 0.61–1.24)
Calcium: 9.2 mg/dL (ref 8.9–10.3)
Chloride: 107 mmol/L (ref 98–111)
GFR calc non Af Amer: 57 mL/min — ABNORMAL LOW (ref >60)
Glucose, Bld: 343 mg/dL — ABNORMAL HIGH (ref 70–99)
Potassium: 4.4 mmol/L (ref 3.5–5.1)
SODIUM: 137 mmol/L (ref 135–145)

## 2018-06-07 LAB — GLUCOSE, CAPILLARY
Glucose-Capillary: 316 mg/dL — ABNORMAL HIGH (ref 70–99)
Glucose-Capillary: 169 mg/dL — ABNORMAL HIGH (ref 70–99)

## 2018-06-07 MED ORDER — PREDNISONE 10 MG PO TABS: ORAL_TABLET | ORAL | 0 refills | Status: AC

## 2018-06-07 MED ORDER — AZITHROMYCIN 500 MG PO TABS: 500.0000 mg | ORAL_TABLET | Freq: Every day | ORAL | 0 refills | Status: AC

## 2018-06-07 MED ORDER — ENSURE MAX PROTEIN PO LIQD: 11.0000 [oz_av] | Freq: Two times a day (BID) | ORAL | 0 refills | Status: AC

## 2018-06-07 MED ORDER — GUAIFENESIN-DM 100-10 MG/5ML PO SYRP: 5.0000 mL | ORAL_SOLUTION | ORAL | 0 refills | Status: AC | PRN

## 2018-06-07 NOTE — Discharge Summary (Signed)
[facility] [place] at [facility]   PATIENT NAME: Curtis Russell    MR#:  [id number]  DATE OF BIRTH:  1956/03/21  DATE OF ADMISSION:  06/04/2018   ADMITTING PHYSICIAN: Adrian Saran, MD  DATE OF DISCHARGE: 06/07/2018  PRIMARY CARE PHYSICIAN: Jerl Mina, MD   ADMISSION DIAGNOSIS:  sob DISCHARGE DIAGNOSIS:  Active Problems:   COPD exacerbation (HCC)  SECONDARY DIAGNOSIS:   Past Medical History:  Diagnosis Date  . COPD (chronic obstructive pulmonary disease) (HCC)   . Diabetes mellitus without complication (HCC) 2014  . Hyperlipidemia   . Nasal polyp   . Pleurisy 2015  . Pneumonia   . Sleep apnea    HOSPITAL COURSE:  Chief complaint; shortness of breath and malaise  History of presenting complaint; Curtis Russell  is a 63 y.o. male with a known history of sleep apnea on CPAP at night, COPD on oxygen at night who presented to the emergency room due to fever, malaise and shortness of breath.  Patient has chronic shortness of breath and only ambulates about 10 feet on a daily basis however over the past 3 days his symptoms have worsened.  He also endorses cough and chills.  He does not have body aches.  His cough is nonproductive in nature. Influenza testing is negative.  Patient was diagnosed with pneumonia clinically.  Please refer to the H&P dictated for further details.   Hospital course; 1. Acute exacerbation of COPD: During the course of this admission patient was treated with IV Solu-Medrol.  Was gradually tapered down and transition to p.o. prednisone on discharge.  Patient significantly improved.  Already weaned off oxygen.  Ambulating well with nursing staff with no shortness of breath.  Plans for discharge home on p.o. antibiotics and prednisone taper.  Continue nebulizer treatments at home as previously.  2. Community-acquired pneumonia: Diagnosed clinically. Was treated with IV azithromycin and Rocephin during this admission.  Being discharged on  p.o. azithromycin for 5 more days to complete treatment duration  3.Hypokalemia: repleted  4.Tobacco dependence: Patient is encouraged to quit smoking. Counseling was provided for 4 minutes.  5. Acute kidney injury:  Renal function significantly improved prior to discharge.  Outpatient monitoring by primary care physician   6. Diabetes mellitus type II: Continue home regimen on discharge.  Follow-up with primary care physician for monitoring.   7. Hyperlipidemia: Continue statin  8. Elevated troponin in the setting of demand ischemia- neg serial troponins  9.OSA: Continue CPAP at night   DISCHARGE CONDITIONS:  Patient clinically and hemodynamically stable. CONSULTS OBTAINED:   DRUG ALLERGIES:   Allergies  Allergen Reactions  . Bee Venom Anaphylaxis   DISCHARGE MEDICATIONS:   Allergies as of 06/07/2018      Reactions   Bee Venom Anaphylaxis      Medication List    TAKE these medications   albuterol 108 (90 Base) MCG/ACT inhaler Commonly known as:  PROVENTIL HFA;VENTOLIN HFA Inhale 2 puffs into the lungs every 6 (six) hours as needed.   aspirin EC 81 MG tablet Take 81 mg by mouth daily.   atorvastatin 40 MG tablet Commonly known as:  LIPITOR Take 40 mg by mouth daily.   azithromycin 500 MG tablet Commonly known as:  ZITHROMAX Take 1 tablet (500 mg total) by mouth daily. Start taking on:  June 08, 2018   budesonide-formoterol 160-4.5 MCG/ACT inhaler Commonly known as:  SYMBICORT Inhale 2 puffs into the lungs 2 (two) times daily.   canagliflozin 300 MG Tabs  tablet Commonly known as:  INVOKANA Take 300 mg by mouth daily before breakfast.   diltiazem 120 MG 24 hr capsule Commonly known as:  CARDIZEM CD Take 120 mg by mouth daily.   diphenhydrAMINE 25 MG tablet Commonly known as:  SOMINEX Take by mouth at bedtime.   ENSURE MAX PROTEIN Liqd Take 330 mLs (11 oz total) by mouth 2 (two) times daily.   Fluticasone-Umeclidin-Vilant  100-62.5-25 MCG/INH Aepb Inhale 1 puff into the lungs daily.   glipiZIDE 10 MG 24 hr tablet Commonly known as:  GLUCOTROL XL Take 10 mg by mouth every evening.   guaiFENesin-dextromethorphan 100-10 MG/5ML syrup Commonly known as:  ROBITUSSIN DM Take 5 mLs by mouth every 4 (four) hours as needed for cough.   ipratropium-albuterol 0.5-2.5 (3) MG/3ML Soln Commonly known as:  DUONEB Take 3 mLs by nebulization 4 (four) times daily.   isosorbide mononitrate 30 MG 24 hr tablet Commonly known as:  IMDUR Take 30 mg by mouth daily.   lisinopril-hydrochlorothiazide 20-12.5 MG tablet Commonly known as:  PRINZIDE,ZESTORETIC Take 1 tablet by mouth daily.   metFORMIN 500 MG tablet Commonly known as:  GLUCOPHAGE Take 1,000 mg by mouth 2 (two) times daily with a meal.   predniSONE 10 MG tablet Commonly known as:  DELTASONE 40 mg p.o. daily x3 days Then 30 mg p.o. daily x3 days Then 20 mg p.o. daily x3 days Then 10 mg p.o. daily x3 days.   theophylline 300 MG 12 hr tablet Commonly known as:  THEODUR Take 300 mg by mouth every 12 (twelve) hours.   traZODone 50 MG tablet Commonly known as:  DESYREL Take 200 mg by mouth at bedtime.   TRULICITY 1.5 MG/0.5ML Sopn Generic drug:  Dulaglutide Inject 1.5 mg into the skin every Tuesday.        DISCHARGE INSTRUCTIONS:   DIET:  Diabetic diet DISCHARGE CONDITION:  Stable ACTIVITY:  Activity as tolerated OXYGEN:  Home Oxygen: Yes.    Oxygen Delivery: Patient on oxygen at night at home. DISCHARGE LOCATION:  home   If you experience worsening of your admission symptoms, develop shortness of breath, life threatening emergency, suicidal or homicidal thoughts you must seek medical attention immediately by calling 911 or calling your MD immediately  if symptoms less severe.  You Must read complete instructions/literature along with all the possible adverse reactions/side effects for all the Medicines you take and that have been prescribed  to you. Take any new Medicines after you have completely understood and accpet all the possible adverse reactions/side effects.   Please note  You were cared for by a hospitalist during your hospital stay. If you have any questions about your discharge medications or the care you received while you were in the hospital after you are discharged, you can call the unit and asked to speak with the hospitalist on call if the hospitalist that took care of you is not available. Once you are discharged, your primary care physician will handle any further medical issues. Please note that NO REFILLS for any discharge medications will be authorized once you are discharged, as it is imperative that you return to your primary care physician (or establish a relationship with a primary care physician if you do not have one) for your aftercare needs so that they can reassess your need for medications and monitor your lab values.    On the day of Discharge:  VITAL SIGNS:  Blood pressure 132/71, pulse 76, temperature 97.6 F (36.4 C), temperature source Oral,  resp. rate 20, height 5\' 11"  (1.803 m), weight 115.7 kg, SpO2 96 %. PHYSICAL EXAMINATION:  GENERAL:  63 y.o.-year-old patient lying in the bed with no acute distress.  EYES: Pupils equal, round, reactive to light and accommodation. No scleral icterus. Extraocular muscles intact.  HEENT: Head atraumatic, normocephalic. Oropharynx and nasopharynx clear.  NECK:  Supple, no jugular venous distention. No thyroid enlargement, no tenderness.  LUNGS: Normal breath sounds bilaterally, no wheezing, rales,rhonchi or crepitation. No use of accessory muscles of respiration.  CARDIOVASCULAR: S1, S2 normal. No murmurs, rubs, or gallops.  ABDOMEN: Soft, non-tender, non-distended. Bowel sounds present. No organomegaly or mass.  EXTREMITIES: No pedal edema, cyanosis, or clubbing.  NEUROLOGIC: Cranial nerves II through XII are intact. Muscle strength 5/5 in all extremities.  Sensation intact. Gait not checked.  PSYCHIATRIC: The patient is alert and oriented x 3.  SKIN: No obvious rash, lesion, or ulcer.  DATA REVIEW:   CBC Recent Labs  Lab 06/06/18 0519  WBC 13.8*  HGB 13.4  HCT 39.9  PLT 188    Chemistries  Recent Labs  Lab 06/05/18 0528  06/07/18 0518  NA  --    < > 137  K  --    < > 4.4  CL  --    < > 107  CO2  --    < > 19*  GLUCOSE  --    < > 343*  BUN  --    < > 44*  CREATININE  --    < > 1.33*  CALCIUM  --    < > 9.2  MG 2.2  --   --    < > = values in this interval not displayed.     Microbiology Results  Results for orders placed or performed during the hospital encounter of 06/04/18  Culture, blood (routine x 2)     Status: None (Preliminary result)   Collection Time: 06/04/18 11:21 AM  Result Value Ref Range Status   Specimen Description BLOOD BLOOD LEFT FOREARM  Final   Special Requests   Final    BOTTLES DRAWN AEROBIC AND ANAEROBIC Blood Culture adequate volume   Culture   Final    NO GROWTH 3 DAYS Performed at [facility], [address], [place], [place] [address]    Report Status PENDING  Incomplete  Culture, blood (routine x 2)     Status: None (Preliminary result)   Collection Time: 06/04/18 11:31 AM  Result Value Ref Range Status   Specimen Description BLOOD LEFT ANTECUBITAL  Final   Special Requests   Final    BOTTLES DRAWN AEROBIC AND ANAEROBIC Blood Culture adequate volume   Culture   Final    NO GROWTH 3 DAYS Performed at [facility], [address], [place], [place] [address]    Report Status PENDING  Incomplete    RADIOLOGY:  No results found.   Management plans discussed with the patient, family and they are in agreement.  CODE STATUS: DNR   TOTAL TIME TAKING CARE OF THIS PATIENT: 35 minutes.    Matisha Termine M.D on 06/07/2018 at 10:31 AM  Between 7am to 6pm - Pager - [phone]  After 6pm go to [email or web address] - Social research officer, government  [facility] [place]  [facility]  Office  [phone]  CC: Primary care physician; Jerl Mina, MD   Note: This dictation was prepared with Dragon dictation along with smaller phrase technology. Any transcriptional errors that result from this process are unintentional.

## 2018-06-07 NOTE — Progress Notes (Signed)
Offered to change patients linen when he is ready to bathe.  Pt said his wife will assist him in the shower later this morning and we can change his linen at that time

## 2018-06-07 NOTE — Progress Notes (Signed)
Patient discharging home, instructions given to patient, verbalized understanding. Iv removed. Transported home via wife.

## 2018-06-09 LAB — CULTURE, BLOOD (ROUTINE X 2)
Culture: NO GROWTH
Culture: NO GROWTH
SPECIAL REQUESTS: ADEQUATE
Special Requests: ADEQUATE

## 2021-05-21 ENCOUNTER — Other Ambulatory Visit
Admission: RE | Admit: 2021-05-21 | Discharge: 2021-05-21 | Disposition: A | Discharge status: Home / Self Care | Payer: Medicare Other | Source: Ambulatory Visit | Attending: Student | Admitting: Student

## 2021-05-21 DIAGNOSIS — Z01818 Encounter for other preprocedural examination: Secondary | ICD-10-CM | POA: Insufficient documentation

## 2021-05-21 DIAGNOSIS — R0602 Shortness of breath: Secondary | ICD-10-CM | POA: Diagnosis not present

## 2021-05-21 LAB — BRAIN NATRIURETIC PEPTIDE: B Natriuretic Peptide: 14.1 pg/mL (ref 0.0–100.0)

## 2021-06-04 ENCOUNTER — Encounter
Admission: RE | Disposition: A | Discharge status: Home / Self Care | Payer: Self-pay | Source: Ambulatory Visit | Attending: Internal Medicine

## 2021-06-04 ENCOUNTER — Other Ambulatory Visit: Payer: Self-pay

## 2021-06-04 ENCOUNTER — Ambulatory Visit
Admission: RE | Admit: 2021-06-04 | Discharge: 2021-06-04 | Disposition: A | Discharge status: Home / Self Care | Payer: Medicare Other | Source: Ambulatory Visit | Attending: Internal Medicine | Admitting: Internal Medicine

## 2021-06-04 ENCOUNTER — Encounter: Payer: Self-pay | Admitting: Internal Medicine

## 2021-06-04 DIAGNOSIS — Z6838 Body mass index (BMI) 38.0-38.9, adult: Secondary | ICD-10-CM | POA: Diagnosis not present

## 2021-06-04 DIAGNOSIS — I272 Pulmonary hypertension, unspecified: Secondary | ICD-10-CM

## 2021-06-04 DIAGNOSIS — E119 Type 2 diabetes mellitus without complications: Secondary | ICD-10-CM | POA: Insufficient documentation

## 2021-06-04 DIAGNOSIS — E785 Hyperlipidemia, unspecified: Secondary | ICD-10-CM | POA: Diagnosis not present

## 2021-06-04 DIAGNOSIS — F17210 Nicotine dependence, cigarettes, uncomplicated: Secondary | ICD-10-CM | POA: Diagnosis not present

## 2021-06-04 DIAGNOSIS — I10 Essential (primary) hypertension: Secondary | ICD-10-CM | POA: Diagnosis not present

## 2021-06-04 DIAGNOSIS — Z79899 Other long term (current) drug therapy: Secondary | ICD-10-CM | POA: Insufficient documentation

## 2021-06-04 DIAGNOSIS — G4733 Obstructive sleep apnea (adult) (pediatric): Secondary | ICD-10-CM | POA: Diagnosis not present

## 2021-06-04 DIAGNOSIS — J449 Chronic obstructive pulmonary disease, unspecified: Secondary | ICD-10-CM | POA: Insufficient documentation

## 2021-06-04 DIAGNOSIS — I200 Unstable angina: Secondary | ICD-10-CM

## 2021-06-04 DIAGNOSIS — E669 Obesity, unspecified: Secondary | ICD-10-CM | POA: Insufficient documentation

## 2021-06-04 HISTORY — DX: Essential (primary) hypertension: I10

## 2021-06-04 HISTORY — PX: RIGHT/LEFT HEART CATH AND CORONARY ANGIOGRAPHY: CATH118266

## 2021-06-04 SURGERY — RIGHT/LEFT HEART CATH AND CORONARY ANGIOGRAPHY
Anesthesia: Moderate Sedation

## 2021-06-04 MED ORDER — SODIUM CHLORIDE 0.9% FLUSH: 3.0000 mL | INTRAVENOUS | Status: DC | PRN

## 2021-06-04 MED ORDER — SODIUM CHLORIDE 0.9 % WEIGHT BASED INFUSION
3.0000 mL/kg/h | INTRAVENOUS | Status: AC
  Administered 2021-06-04: 3 mL/kg/h via INTRAVENOUS

## 2021-06-04 MED ORDER — LIDOCAINE HCL 1 % IJ SOLN
INTRAMUSCULAR | Status: AC
  Filled 2021-06-04: qty 20

## 2021-06-04 MED ORDER — IOHEXOL 300 MG/ML  SOLN
INTRAMUSCULAR | Status: DC | PRN
Start: 2021-06-04 — End: 2021-06-04
  Administered 2021-06-04: 85 mL

## 2021-06-04 MED ORDER — LABETALOL HCL 5 MG/ML IV SOLN: 10.0000 mg | INTRAVENOUS | Status: DC | PRN

## 2021-06-04 MED ORDER — SODIUM CHLORIDE 0.9 % IV SOLN: 250.0000 mL | INTRAVENOUS | Status: DC | PRN

## 2021-06-04 MED ORDER — ONDANSETRON HCL 4 MG/2ML IJ SOLN
INTRAMUSCULAR | Status: AC
  Filled 2021-06-04: qty 2

## 2021-06-04 MED ORDER — ASPIRIN 81 MG PO CHEW: 81.0000 mg | CHEWABLE_TABLET | ORAL | Status: DC

## 2021-06-04 MED ORDER — ONDANSETRON HCL 4 MG/2ML IJ SOLN: 4.0000 mg | Freq: Four times a day (QID) | INTRAMUSCULAR | Status: DC | PRN

## 2021-06-04 MED ORDER — HYDRALAZINE HCL 20 MG/ML IJ SOLN: 10.0000 mg | INTRAMUSCULAR | Status: DC | PRN

## 2021-06-04 MED ORDER — SODIUM CHLORIDE 0.9% FLUSH: 3.0000 mL | Freq: Two times a day (BID) | INTRAVENOUS | Status: DC

## 2021-06-04 MED ORDER — HEPARIN (PORCINE) IN NACL 1000-0.9 UT/500ML-% IV SOLN
INTRAVENOUS | Status: AC
  Filled 2021-06-04: qty 1000

## 2021-06-04 MED ORDER — FENTANYL CITRATE (PF) 100 MCG/2ML IJ SOLN
INTRAMUSCULAR | Status: DC | PRN
  Administered 2021-06-04: 25 ug via INTRAVENOUS

## 2021-06-04 MED ORDER — MIDAZOLAM HCL 2 MG/2ML IJ SOLN
INTRAMUSCULAR | Status: AC
  Filled 2021-06-04: qty 2

## 2021-06-04 MED ORDER — HEPARIN (PORCINE) IN NACL 1000-0.9 UT/500ML-% IV SOLN
INTRAVENOUS | Status: DC | PRN
  Administered 2021-06-04 (×2): 500 mL

## 2021-06-04 MED ORDER — ACETAMINOPHEN 325 MG PO TABS: 650.0000 mg | ORAL_TABLET | ORAL | Status: DC | PRN

## 2021-06-04 MED ORDER — ONDANSETRON HCL 4 MG/2ML IJ SOLN
INTRAMUSCULAR | Status: DC | PRN
Start: 2021-06-04 — End: 2021-06-04
  Administered 2021-06-04: 4 mg via INTRAVENOUS

## 2021-06-04 MED ORDER — FENTANYL CITRATE (PF) 100 MCG/2ML IJ SOLN
INTRAMUSCULAR | Status: AC
  Filled 2021-06-04: qty 2

## 2021-06-04 MED ORDER — SODIUM CHLORIDE 0.9 % WEIGHT BASED INFUSION
1.0000 mL/kg/h | INTRAVENOUS | Status: DC
  Administered 2021-06-04: 1 mL/kg/h via INTRAVENOUS

## 2021-06-04 MED ORDER — SODIUM CHLORIDE 0.9 % WEIGHT BASED INFUSION: 1.0000 mL/kg/h | INTRAVENOUS | Status: DC

## 2021-06-04 MED ORDER — LIDOCAINE HCL (PF) 1 % IJ SOLN
INTRAMUSCULAR | Status: DC | PRN
  Administered 2021-06-04: 20 mL

## 2021-06-04 MED ORDER — MIDAZOLAM HCL 2 MG/2ML IJ SOLN
INTRAMUSCULAR | Status: DC | PRN
  Administered 2021-06-04: 1 mg via INTRAVENOUS

## 2021-06-04 SURGICAL SUPPLY — 19 items
CATH INFINITI 5FR MULTPACK ANG (CATHETERS) ×1 IMPLANT
CATH SWAN GANZ 7F STRAIGHT (CATHETERS) ×1 IMPLANT
DEVICE CLOSURE MYNXGRIP 5F (Vascular Products) ×1 IMPLANT
DEVICE CLOSURE MYNXGRIP 6/7F (Vascular Products) ×1 IMPLANT
DRAPE BRACHIAL (DRAPES) IMPLANT
GLIDESHEATH SLEND SS 6F .021 (SHEATH) IMPLANT
GUIDEWIRE INQWIRE 1.5J.035X260 (WIRE) IMPLANT
INQWIRE 1.5J .035X260CM (WIRE)
NDL PERC 18GX7CM (NEEDLE) IMPLANT
NEEDLE PERC 18GX7CM (NEEDLE) ×2 IMPLANT
PACK CARDIAC CATH (CUSTOM PROCEDURE TRAY) ×2 IMPLANT
PANNUS RETENTION SYSTEM 2 PAD (MISCELLANEOUS) ×1 IMPLANT
PROTECTION STATION PRESSURIZED (MISCELLANEOUS) ×2
SET ATX SIMPLICITY (MISCELLANEOUS) ×1 IMPLANT
SHEATH AVANTI 5FR X 11CM (SHEATH) ×1 IMPLANT
SHEATH GLIDE SLENDER 4/5FR (SHEATH) IMPLANT
SHEATH PINNACLE 7F 10CM (SHEATH) ×1 IMPLANT
STATION PROTECTION PRESSURIZED (MISCELLANEOUS) IMPLANT
WIRE GUIDERIGHT .035X150 (WIRE) ×1 IMPLANT

## 2021-06-05 ENCOUNTER — Encounter: Payer: Self-pay | Admitting: Internal Medicine

## 2021-06-05 LAB — CARDIAC CATHETERIZATION: Cath EF Quantitative: 55 %

## 2023-03-21 ENCOUNTER — Other Ambulatory Visit: Payer: Self-pay | Admitting: Specialist

## 2023-03-21 DIAGNOSIS — F17210 Nicotine dependence, cigarettes, uncomplicated: Secondary | ICD-10-CM

## 2023-03-21 DIAGNOSIS — J449 Chronic obstructive pulmonary disease, unspecified: Secondary | ICD-10-CM

## 2023-03-31 ENCOUNTER — Ambulatory Visit
Admission: RE | Admit: 2023-03-31 | Discharge: 2023-03-31 | Disposition: A | Discharge status: Home / Self Care | Payer: Medicare Other | Source: Ambulatory Visit | Attending: Specialist | Admitting: Specialist

## 2023-03-31 DIAGNOSIS — J449 Chronic obstructive pulmonary disease, unspecified: Secondary | ICD-10-CM | POA: Insufficient documentation

## 2023-03-31 DIAGNOSIS — F17210 Nicotine dependence, cigarettes, uncomplicated: Secondary | ICD-10-CM | POA: Diagnosis present

## 2024-03-09 ENCOUNTER — Other Ambulatory Visit (INDEPENDENT_AMBULATORY_CARE_PROVIDER_SITE_OTHER): Payer: Self-pay | Admitting: Nurse Practitioner

## 2024-03-09 DIAGNOSIS — I739 Peripheral vascular disease, unspecified: Secondary | ICD-10-CM

## 2024-03-10 ENCOUNTER — Ambulatory Visit (INDEPENDENT_AMBULATORY_CARE_PROVIDER_SITE_OTHER): Payer: No Typology Code available for payment source | Admitting: Nurse Practitioner

## 2024-03-10 ENCOUNTER — Ambulatory Visit (INDEPENDENT_AMBULATORY_CARE_PROVIDER_SITE_OTHER)

## 2024-03-10 ENCOUNTER — Encounter (INDEPENDENT_AMBULATORY_CARE_PROVIDER_SITE_OTHER): Payer: Self-pay | Admitting: Nurse Practitioner

## 2024-03-10 VITALS — BP 130/79 | HR 88 | Resp 18 | Ht 69.0 in | Wt 236.2 lb

## 2024-03-10 DIAGNOSIS — I739 Peripheral vascular disease, unspecified: Secondary | ICD-10-CM | POA: Diagnosis not present

## 2024-03-10 DIAGNOSIS — R29898 Other symptoms and signs involving the musculoskeletal system: Secondary | ICD-10-CM

## 2024-03-15 ENCOUNTER — Encounter (INDEPENDENT_AMBULATORY_CARE_PROVIDER_SITE_OTHER): Payer: Self-pay | Admitting: Nurse Practitioner

## 2024-03-15 LAB — VAS US ABI WITH/WO TBI
Left ABI: 1.09
Right ABI: 1.07

## 2024-03-15 NOTE — Progress Notes (Signed)
 Subjective:    Patient ID: Curtis Russell, male    DOB: [birth year], 68 y.o.   MRN: [id number] Chief Complaint  Patient presents with   New Patient (Initial Visit)    Ref Solum consult claudication of lower extremity     HPI  Discussed the use of AI scribe software for clinical note transcription with the patient, who gave verbal consent to proceed.  History of Present Illness Curtis Russell is a 68 year old male with diabetes and coronary artery disease who presents with progressive leg weakness and shortness of breath. He was referred by Dr. Damian for evaluation of his symptoms.  He has been experiencing significant leg weakness and shortness of breath for the past few months, with symptoms worsening over the last month. He has difficulty walking more than thirty paces without needing to sit down and often feels like he is 'falling forward'. These symptoms occur even when standing still, such as when brushing his teeth, and his heart rate increases to 140-150 bpm, leaving him breathless.  His feet are consistently cold and numb, with the numbness described as 'dull' rather than 'tingly'. No back pain is reported. He has a history of diabetes.  He has a history of coronary artery disease and had a stent placed in the left anterior descending artery two years ago. He is currently on Lipitor, aspirin , and Effient. He monitors his vitals daily, noting that his blood pressure and oxygen levels remain within normal ranges.  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    Results DIAGNOSTIC Ankle-Brachial Index (ABI): Right leg 1.07, Left leg 1.09 Toe-Brachial Index (TBI): Right leg 0.83, Left leg 0.81  Strong triphasic tibial artery waveforms noted  bilaterally with good toe waveforms bilaterally.   Review of Systems  Respiratory:  Positive for shortness of breath.   Neurological:  Positive for weakness.  All other systems reviewed and are negative.      Objective:   Physical Exam Vitals reviewed.  HENT:     Head: Normocephalic.  Cardiovascular:     Rate and Rhythm: Normal rate.     Pulses:          Dorsalis pedis pulses are 1+ on the right side and 1+ on the left side.       Posterior tibial pulses are 1+ on the right side and 1+ on the left side.  Skin:    General: Skin is warm and dry.  Neurological:     Mental Status: He is alert and oriented to person, place, and time.  Psychiatric:        Mood and Affect: Mood normal.        Behavior: Behavior normal.        Thought Content: Thought content normal.        Judgment: Judgment normal.     Physical Exam EXTREMITIES: Pulses palpable but not strong.  BP 130/79 (BP Location: Left Arm)   Pulse 88   Resp 18   Ht 5' 9 (1.753 m)   Wt 236 lb 3.2 oz (107.1 kg)   BMI 34.88 kg/m   Past Medical History:  Diagnosis Date   COPD (chronic obstructive pulmonary disease) (HCC)    Diabetes mellitus without complication (HCC) 04/15/2012   Hyperlipidemia  Hypertension    Nasal polyp    Pleurisy 04/15/2013   Pneumonia    Sleep apnea     Social History   Socioeconomic History   Marital status: Married    Spouse name: Not on file   Number of children: Not on file   Years of education: Not on file   Highest education level: Not on file  Occupational History   Not on file  Tobacco Use   Smoking status: Every Day    Current packs/day: 1.50    Average packs/day: 1.5 packs/day for 30.0 years (45.0 ttl pk-yrs)    Types: Cigarettes   Smokeless tobacco: Never  Substance and Sexual Activity   Alcohol use: No    Alcohol/week: 0.0 standard drinks of alcohol   Drug use: No   Sexual activity: Not on file  Other Topics Concern   Not on file  Social History Narrative    Not on file   Social Drivers of Health   Financial Resource Strain: Patient Declined (05/22/2023)   Received from [facility]   Overall Financial Resource Strain (CARDIA)    Difficulty of Paying Living Expenses: Patient declined  Food Insecurity: Patient Declined (05/22/2023)   Received from [facility]   Hunger Vital Sign    Within the past 12 months, you worried that your food would run out before you got the money to buy more.: Patient declined    Within the past 12 months, the food you bought just didn't last and you didn't have money to get more.: Patient declined  Transportation Needs: Patient Declined (05/22/2023)   Received from [facility] - Transportation    In the past 12 months, has lack of transportation kept you from medical appointments or from getting medications?: Patient declined    Lack of Transportation (Non-Medical): Patient declined  Physical Activity: Not on file  Stress: Not on file  Social Connections: Not on file  Intimate Partner Violence: Not on file    Past Surgical History:  Procedure Laterality Date   BRONCHOSCOPY     HERNIA REPAIR  04/16/1999   HERNIA REPAIR     bil inguinal   NASAL SINUS SURGERY     RIGHT/LEFT HEART CATH AND CORONARY ANGIOGRAPHY Bilateral 12/11/2016   Procedure: RIGHT/LEFT HEART CATH AND CORONARY ANGIOGRAPHY;  Surgeon: Florencio Cara BIRCH, MD;  Location: [facility] INVASIVE CV LAB;  Service: Cardiovascular;  Laterality: Bilateral;   RIGHT/LEFT HEART CATH AND CORONARY ANGIOGRAPHY N/A 06/04/2021   Procedure: RIGHT/LEFT HEART CATH AND CORONARY ANGIOGRAPHY with poss Intervention;  Surgeon: Florencio Cara BIRCH, MD;  Location: [facility] INVASIVE CV LAB;  Service: Cardiovascular;  Laterality: N/A;    Family History  Problem Relation Age of Onset   COPD Mother    Diabetes Father     Allergies  Allergen Reactions   Bee Venom Anaphylaxis       Latest Ref Rng & Units 06/06/2018     5:19 AM 06/05/2018    5:20 AM 06/04/2018   10:13 AM  CBC  WBC 4.0 - 10.5 K/uL 13.8  11.1  14.8   Hemoglobin 13.0 - 17.0 g/dL 86.5  86.4  85.0   Hematocrit 39.0 - 52.0 % 39.9  39.9  43.5   Platelets 150 - 400 K/uL 188  179  189       CMP     Component Value Date/Time   NA 137 06/07/2018 0518   NA 137 03/22/2014 0416  K 4.4 06/07/2018 0518   K 4.2 03/22/2014 0416   CL 107 06/07/2018 0518   CL 103 03/22/2014 0416   CO2 19 (L) 06/07/2018 0518   CO2 26 03/22/2014 0416   GLUCOSE 343 (H) 06/07/2018 0518   GLUCOSE 199 (H) 03/22/2014 0416   BUN 44 (H) 06/07/2018 0518   BUN 19 (H) 03/22/2014 0416   CREATININE 1.33 (H) 06/07/2018 0518   CREATININE 0.94 03/25/2014 0408   CALCIUM  9.2 06/07/2018 0518   CALCIUM  8.6 03/22/2014 0416   PROT 7.9 03/21/2014 1457   ALBUMIN 3.5 03/21/2014 1457   AST 22 03/21/2014 1457   ALT 34 03/21/2014 1457   ALKPHOS 150 (H) 03/21/2014 1457   BILITOT 1.0 03/21/2014 1457   GFRNONAA 57 (L) 06/07/2018 0518   GFRNONAA >60 03/25/2014 0408   GFRNONAA >60 10/17/2012 0438     No results found.     Assessment & Plan:   1. Leg weakness, bilateral (Primary) Progressive bilateral leg weakness and numbness Progressive leg weakness and numbness with normal vascular studies. Differential includes neurological causes. Previous anaphylactic shock and brain injury may contribute. - Discuss symptoms with Dr. Valora and consider neurology referral. - Continue follow-up with Dr. Theotis and Dr. Florencio for heart and breathing issues. - Monitor for new or worsening symptoms and contact vascular surgery if changes occur.     Current Outpatient Medications on File Prior to Visit  Medication Sig Dispense Refill   albuterol  (PROVENTIL  HFA;VENTOLIN  HFA) 108 (90 Base) MCG/ACT inhaler Inhale 2 puffs into the lungs every 6 (six) hours as needed for shortness of breath or wheezing.     aspirin  EC 81 MG tablet Take 81 mg by mouth at bedtime.     atorvastatin  (LIPITOR) 40  MG tablet Take 40 mg by mouth daily.     ciclopirox (LOPROX) 0.77 % SUSP Apply topically daily.     clindamycin (CLEOCIN T) 1 % lotion Apply topically 2 (two) times daily as needed.     clotrimazole-betamethasone (LOTRISONE) cream APPLY  CREAM TOPICALLY TO AFFECTED AREA TWICE DAILY DO NOT USE FOR MORE THAN 2 WEEKS     dapagliflozin propanediol (FARXIGA) 10 MG TABS tablet Take 10 mg by mouth daily.     diltiazem  (CARDIZEM  CD) 180 MG 24 hr capsule Take 180 mg by mouth daily.     diphenhydrAMINE  (BENADRYL ) 25 MG tablet Take 25 mg by mouth every 6 (six) hours as needed for allergies.     Fluticasone-Umeclidin-Vilant 100-62.5-25 MCG/INH AEPB Inhale 1 puff into the lungs daily.     furosemide (LASIX) 20 MG tablet Take 20 mg by mouth daily.     ipratropium-albuterol  (DUONEB) 0.5-2.5 (3) MG/3ML SOLN Take 3 mLs by nebulization 4 (four) times daily.      isosorbide  mononitrate (IMDUR ) 30 MG 24 hr tablet Take 30 mg by mouth daily.     Lancets 33G MISC by Does not apply route.     levalbuterol  (XOPENEX ) 1.25 MG/3ML nebulizer solution Inhale 1.25 mg into the lungs every 8 (eight) hours as needed.     lisinopril  (ZESTRIL ) 40 MG tablet Take 40 mg by mouth daily.     metFORMIN  (GLUCOPHAGE ) 500 MG tablet Take 1,000 mg by mouth 2 (two) times daily with a meal.     mometasone  (NASONEX ) 50 MCG/ACT nasal spray Place 2 sprays into the nose daily.     Multiple Vitamins-Minerals (AIRBORNE PO) Take 1 Package by mouth daily.     nitroGLYCERIN (NITROSTAT) 0.4 MG SL tablet Place 0.4  mg under the tongue every 5 (five) minutes as needed.     prasugrel (EFFIENT) 10 MG TABS tablet Take 10 mg by mouth daily.     traZODone  (DESYREL ) 100 MG tablet Take 400 mg by mouth at bedtime.     TRULICITY  1.5 MG/0.5ML SOPN Inject 1.5 mg into the skin every Wednesday.     azithromycin  (ZITHROMAX ) 500 MG tablet Take 1 tablet (500 mg total) by mouth daily. (Patient not taking: Reported on 03/10/2024) 5 tablet 0   ENSURE MAX PROTEIN (ENSURE MAX  PROTEIN) LIQD Take 330 mLs (11 oz total) by mouth 2 (two) times daily. (Patient not taking: Reported on 03/10/2024) 60 mL 0   glipiZIDE  (GLUCOTROL  XL) 2.5 MG 24 hr tablet Take 2.5 mg by mouth at bedtime. (Patient not taking: Reported on 03/10/2024)     guaiFENesin -dextromethorphan  (ROBITUSSIN DM) 100-10 MG/5ML syrup Take 5 mLs by mouth every 4 (four) hours as needed for cough. (Patient not taking: Reported on 03/10/2024) 118 mL 0   lisinopril -hydrochlorothiazide  (PRINZIDE ,ZESTORETIC ) 20-12.5 MG tablet Take 2 tablets by mouth daily.     predniSONE  (DELTASONE ) 10 MG tablet 40 mg p.o. daily x3 days Then 30 mg p.o. daily x3 days Then 20 mg p.o. daily x3 days Then 10 mg p.o. daily x3 days. (Patient not taking: Reported on 03/10/2024) 30 tablet 0   theophylline  (THEODUR) 300 MG 12 hr tablet Take 300 mg by mouth every 12 (twelve) hours. (Patient not taking: Reported on 03/10/2024)     No current facility-administered medications on file prior to visit.    There are no Patient Instructions on file for this visit. No follow-ups on file.   Demont Linford E Tevin Shillingford, NP

## 2024-03-18 ENCOUNTER — Other Ambulatory Visit: Payer: Self-pay | Admitting: Specialist

## 2024-03-18 DIAGNOSIS — Z87891 Personal history of nicotine dependence: Secondary | ICD-10-CM

## 2024-03-18 DIAGNOSIS — J449 Chronic obstructive pulmonary disease, unspecified: Secondary | ICD-10-CM

## 2024-04-26 NOTE — Progress Notes (Unsigned)
 "  Referring Physician:  Florencio Cara BIRCH, MD [address] [place],  [place] [address]  Primary Physician:  Valora Lynwood FALCON, MD  History of Present Illness: 04/28/2024 Mr. Curtis Russell is here today with a chief complaint of back pain radiating down bilateral lower extremities on both the front and back of his legs.  This is associated with weakness and numbness.  He has also had significant changes to his gait and is having trouble walking without falling.  Him his wife states that he constantly sways when he walks and cannot walk much further than 10 feet at this point without falling.  No saddle anesthesia or frank incontinence  In addition, there is concerned that starting in August of this year he started having increased issues with short-term memory, a new tremor, gait instability, issues finding words, and headaches on the left side of his head.  They are not aware of any inciting event to which this started.      Bowel/Bladder Dysfunction: none  Conservative measures:  Physical therapy:  Has not participated in? Multimodal medical therapy including regular antiinflammatories:  prednisone  Injections:  epidural steroid injections?    The symptoms are causing a significant impact on the patient's life.   Review of Systems:  A 10 point review of systems is negative, except for the pertinent positives and negatives detailed in the HPI.  Past Medical History: Past Medical History:  Diagnosis Date   COPD (chronic obstructive pulmonary disease) (HCC)    Diabetes mellitus without complication (HCC) 04/15/2012   Hyperlipidemia    Hypertension    Nasal polyp    Pleurisy 04/15/2013   Pneumonia    Sleep apnea     Past Surgical History: Past Surgical History:  Procedure Laterality Date   BRONCHOSCOPY     HERNIA REPAIR  04/16/1999   HERNIA REPAIR     bil inguinal   NASAL SINUS SURGERY     RIGHT/LEFT HEART CATH AND CORONARY ANGIOGRAPHY Bilateral 12/11/2016    Procedure: RIGHT/LEFT HEART CATH AND CORONARY ANGIOGRAPHY;  Surgeon: Florencio Cara BIRCH, MD;  Location: [facility];  Service: Cardiovascular;  Laterality: Bilateral;   RIGHT/LEFT HEART CATH AND CORONARY ANGIOGRAPHY N/A 06/04/2021   Procedure: RIGHT/LEFT HEART CATH AND CORONARY ANGIOGRAPHY with poss Intervention;  Surgeon: Florencio Cara BIRCH, MD;  Location: [facility];  Service: Cardiovascular;  Laterality: N/A;    Allergies: Allergies as of 04/28/2024 - Review Complete 04/28/2024  Allergen Reaction Noted   Bee venom Anaphylaxis 01/11/2015    Medications: Outpatient Encounter Medications as of 04/28/2024  Medication Sig   albuterol  (PROVENTIL  HFA;VENTOLIN  HFA) 108 (90 Base) MCG/ACT inhaler Inhale 2 puffs into the lungs every 6 (six) hours as needed for shortness of breath or wheezing.   aspirin  EC 81 MG tablet Take 81 mg by mouth at bedtime.   atorvastatin  (LIPITOR) 40 MG tablet Take 40 mg by mouth daily.   azithromycin  (ZITHROMAX ) 500 MG tablet Take 1 tablet (500 mg total) by mouth daily.   ciclopirox (LOPROX) 0.77 % SUSP Apply topically daily.   clindamycin (CLEOCIN T) 1 % lotion Apply topically 2 (two) times daily as needed.   clotrimazole-betamethasone (LOTRISONE) cream APPLY  CREAM TOPICALLY TO AFFECTED AREA TWICE DAILY DO NOT USE FOR MORE THAN 2 WEEKS   dapagliflozin propanediol (FARXIGA) 10 MG TABS tablet Take 10 mg by mouth daily.   diltiazem  (CARDIZEM  CD) 180 MG 24 hr capsule Take 180 mg by mouth daily.   diphenhydrAMINE  (BENADRYL ) 25  MG tablet Take 25 mg by mouth every 6 (six) hours as needed for allergies.   ENSURE MAX PROTEIN (ENSURE MAX PROTEIN) LIQD Take 330 mLs (11 oz total) by mouth 2 (two) times daily.   Fluticasone-Umeclidin-Vilant 100-62.5-25 MCG/INH AEPB Inhale 1 puff into the lungs daily.   furosemide (LASIX) 20 MG tablet Take 20 mg by mouth daily.   glipiZIDE  (GLUCOTROL  XL) 2.5 MG 24 hr tablet Take 2.5 mg by mouth at bedtime.    guaiFENesin -dextromethorphan  (ROBITUSSIN DM) 100-10 MG/5ML syrup Take 5 mLs by mouth every 4 (four) hours as needed for cough.   ipratropium-albuterol  (DUONEB) 0.5-2.5 (3) MG/3ML SOLN Take 3 mLs by nebulization 4 (four) times daily.    isosorbide  mononitrate (IMDUR ) 30 MG 24 hr tablet Take 30 mg by mouth daily.   Lancets 33G MISC by Does not apply route.   levalbuterol  (XOPENEX ) 1.25 MG/3ML nebulizer solution Inhale 1.25 mg into the lungs every 8 (eight) hours as needed.   lisinopril  (ZESTRIL ) 40 MG tablet Take 40 mg by mouth daily.   lisinopril -hydrochlorothiazide  (PRINZIDE ,ZESTORETIC ) 20-12.5 MG tablet Take 2 tablets by mouth daily.   metFORMIN  (GLUCOPHAGE ) 500 MG tablet Take 1,000 mg by mouth 2 (two) times daily with a meal.   mometasone  (NASONEX ) 50 MCG/ACT nasal spray Place 2 sprays into the nose daily.   Multiple Vitamins-Minerals (AIRBORNE PO) Take 1 Package by mouth daily.   nitroGLYCERIN (NITROSTAT) 0.4 MG SL tablet Place 0.4 mg under the tongue every 5 (five) minutes as needed.   prasugrel (EFFIENT) 10 MG TABS tablet Take 10 mg by mouth daily.   predniSONE  (DELTASONE ) 10 MG tablet 40 mg p.o. daily x3 days Then 30 mg p.o. daily x3 days Then 20 mg p.o. daily x3 days Then 10 mg p.o. daily x3 days.   theophylline  (THEODUR) 300 MG 12 hr tablet Take 300 mg by mouth every 12 (twelve) hours.   traZODone  (DESYREL ) 100 MG tablet Take 400 mg by mouth at bedtime.   TRULICITY  1.5 MG/0.5ML SOPN Inject 1.5 mg into the skin every Wednesday.   No facility-administered encounter medications on file as of 04/28/2024.    Social History: Social History[1]   1.5 a day  Family Medical History: Family History  Problem Relation Age of Onset   COPD Mother    Diabetes Father     Physical Examination: @VITALWITHPAIN @  General: Patient is well developed, well nourished, calm, collected, and in no apparent distress. Attention to examination is appropriate.  Psychiatric: Patient is  non-anxious.  Head:  Pupils equal, round, and reactive to light.  ENT:  Oral mucosa appears well hydrated.  Neck:   Supple.    Respiratory: Patient is breathing without any difficulty.  Extremities: No edema.  Vascular: Palpable dorsal pedal pulses.  Skin:   On exposed skin, there are no abnormal skin lesions.  NEUROLOGICAL:     Awake, alert, oriented to person, place, and time.    Cranial Nerves: Pupils equal round and reactive to light.  Facial tone is symmetric.    ROM of spine: Some tenderness to palpation of lumbar paraspinals  Slight tremor present   Strength:  Difficult to totally assess strength.  Patient appears to be 4 - in bilateral hip flexion.  Largely 4/5 in bilateral lower extremities and extending out distally.  But again, difficult to assess strength.  He do not appear to have a Hoffmann sign or clonus.  Significant gait abnormality.  Patient unable to walk in a straight line, sways when he walks.  Appears as if he is about to fall when walking.   Medical Decision Making  Imaging: No recent spine imaging.  Assessment and Plan: Mr. Fread is a pleasant 69 y.o. male  is here today with a chief complaint of back pain radiating down bilateral lower extremities on both the front and back of his legs.  This is associated with weakness and numbness.  He has also had significant changes to his gait and is having trouble walking without falling.  Him his wife states that he constantly sways when he walks and cannot walk much further than 10 feet at this point without falling.  No saddle anesthesia or frank incontinence. In addition, there is concerned that starting in August of this year he started having increased issues with short-term memory, a new tremor, gait instability, issues finding words, and headaches on the left side of his head.  They are not aware of any inciting event to which this started.  At this point in time I am concerned about multiple things.   First, from a spine standpoint I have concern for lumbar stenosis as well as myelopathy.  Due to pain primarily in his low back and legs will obtain MRI of lumbar spine first.  However will consider MRI of cervical and thoracic spine at a later date given concerns for myelopathy.  Will also have patient start physical therapy and undergo x-rays today to include flexion-extension for further evaluation.  A referral to physical therapy was placed as well.  Due to patient's new memory loss, headache, significant gait instability, tremor, difficulty with word finding that began in August I would like to obtain an MRI of his brain to check for any acute abnormalities.  I would also like him to have a formal evaluation by a neurologist.  Will review imaging once complete.  Plan to see back in 8 weeks.  Thank you for involving me in the care of this patient.   I spent a total of 45 minutes in both face-to-face and non-face-to-face activities for this visit on the date of this encounter including preparing to see the patient, obtaining and reviewing separately obtained history, performing medically appropriate examination, counseling the patient and their family, ordering additional tests, documenting clinical information, independently interpreting results, coordination of care.   Lyle Decamp, PA-C Dept. of Neurosurgery      [1]  Social History Tobacco Use   Smoking status: Every Day    Current packs/day: 1.50    Average packs/day: 1.5 packs/day for 30.0 years (45.0 ttl pk-yrs)    Types: Cigarettes   Smokeless tobacco: Never  Substance Use Topics   Alcohol use: No    Alcohol/week: 0.0 standard drinks of alcohol   Drug use: No   "

## 2024-04-28 ENCOUNTER — Encounter: Payer: Self-pay | Admitting: Physician Assistant

## 2024-04-28 ENCOUNTER — Other Ambulatory Visit

## 2024-04-28 ENCOUNTER — Ambulatory Visit (INDEPENDENT_AMBULATORY_CARE_PROVIDER_SITE_OTHER): Admitting: Physician Assistant

## 2024-04-28 VITALS — BP 120/82 | Wt 222.8 lb

## 2024-04-28 DIAGNOSIS — M5416 Radiculopathy, lumbar region: Secondary | ICD-10-CM | POA: Diagnosis not present

## 2024-04-28 DIAGNOSIS — R413 Other amnesia: Secondary | ICD-10-CM | POA: Diagnosis not present

## 2024-04-28 DIAGNOSIS — R519 Headache, unspecified: Secondary | ICD-10-CM | POA: Diagnosis not present

## 2024-04-28 DIAGNOSIS — M545 Low back pain, unspecified: Secondary | ICD-10-CM

## 2024-04-28 DIAGNOSIS — R2681 Unsteadiness on feet: Secondary | ICD-10-CM | POA: Diagnosis not present

## 2024-04-28 DIAGNOSIS — R251 Tremor, unspecified: Secondary | ICD-10-CM

## 2024-04-28 DIAGNOSIS — R29898 Other symptoms and signs involving the musculoskeletal system: Secondary | ICD-10-CM | POA: Diagnosis not present

## 2024-05-10 ENCOUNTER — Other Ambulatory Visit

## 2024-05-12 ENCOUNTER — Ambulatory Visit
Admission: RE | Admit: 2024-05-12 | Discharge: 2024-05-12 | Disposition: A | Source: Ambulatory Visit | Attending: Physician Assistant | Admitting: Physician Assistant

## 2024-05-12 ENCOUNTER — Inpatient Hospital Stay
Admission: RE | Admit: 2024-05-12 | Discharge: 2024-05-12 | Disposition: A | Source: Ambulatory Visit | Attending: Physician Assistant | Admitting: Physician Assistant

## 2024-05-12 DIAGNOSIS — R519 Headache, unspecified: Secondary | ICD-10-CM

## 2024-05-12 DIAGNOSIS — R2681 Unsteadiness on feet: Secondary | ICD-10-CM

## 2024-05-12 DIAGNOSIS — M5416 Radiculopathy, lumbar region: Secondary | ICD-10-CM

## 2024-05-12 DIAGNOSIS — R413 Other amnesia: Secondary | ICD-10-CM

## 2024-05-12 DIAGNOSIS — R251 Tremor, unspecified: Secondary | ICD-10-CM

## 2024-05-14 ENCOUNTER — Ambulatory Visit: Payer: Self-pay | Admitting: Physician Assistant

## 2024-05-17 NOTE — Telephone Encounter (Signed)
 Scheduled for follow up with Lyle 4/22, a week after neurology appointment.

## 2024-05-17 NOTE — Telephone Encounter (Signed)
 Patient has appointment with neurology on 4/15. You had recommended f/u in 8 weeks, which would be around 3/11. Do you want to wait to schedule appointment until after patient has seen neurology or go ahead and schedule around 3/11?

## 2024-08-04 ENCOUNTER — Ambulatory Visit: Admitting: Physician Assistant
